# Patient Record
Sex: Male | Born: 1960 | Race: Black or African American | Hispanic: No | Marital: Single | Smoking: Current every day smoker
Health system: Southern US, Community
[De-identification: ages and names within clinical notes are randomized; demographics above are authoritative.]

## PROBLEM LIST (undated history)

## (undated) ENCOUNTER — Ambulatory Visit: Admission: EM | Payer: Medicaid Other | Source: Home / Self Care

## (undated) DIAGNOSIS — R079 Chest pain, unspecified: Secondary | ICD-10-CM

## (undated) DIAGNOSIS — F32A Depression, unspecified: Secondary | ICD-10-CM

## (undated) DIAGNOSIS — E230 Hypopituitarism: Secondary | ICD-10-CM

## (undated) DIAGNOSIS — B029 Zoster without complications: Secondary | ICD-10-CM

## (undated) DIAGNOSIS — F209 Schizophrenia, unspecified: Secondary | ICD-10-CM

## (undated) DIAGNOSIS — E2740 Unspecified adrenocortical insufficiency: Secondary | ICD-10-CM

## (undated) DIAGNOSIS — E291 Testicular hypofunction: Secondary | ICD-10-CM

## (undated) DIAGNOSIS — F419 Anxiety disorder, unspecified: Secondary | ICD-10-CM

## (undated) DIAGNOSIS — E039 Hypothyroidism, unspecified: Secondary | ICD-10-CM

## (undated) DIAGNOSIS — F319 Bipolar disorder, unspecified: Secondary | ICD-10-CM

## (undated) DIAGNOSIS — E785 Hyperlipidemia, unspecified: Secondary | ICD-10-CM

## (undated) DIAGNOSIS — R51 Headache: Secondary | ICD-10-CM

## (undated) DIAGNOSIS — Z720 Tobacco use: Secondary | ICD-10-CM

## (undated) DIAGNOSIS — R519 Headache, unspecified: Secondary | ICD-10-CM

## (undated) DIAGNOSIS — D649 Anemia, unspecified: Secondary | ICD-10-CM

## (undated) DIAGNOSIS — F329 Major depressive disorder, single episode, unspecified: Secondary | ICD-10-CM

## (undated) HISTORY — DX: Hypopituitarism: E23.0

## (undated) HISTORY — DX: Testicular hypofunction: E29.1

## (undated) HISTORY — DX: Schizophrenia, unspecified: F20.9

## (undated) HISTORY — DX: Chest pain, unspecified: R07.9

## (undated) HISTORY — DX: Anemia, unspecified: D64.9

## (undated) HISTORY — DX: Hyperlipidemia, unspecified: E78.5

## (undated) HISTORY — DX: Depression, unspecified: F32.A

## (undated) HISTORY — DX: Bipolar disorder, unspecified: F31.9

## (undated) HISTORY — DX: Tobacco use: Z72.0

## (undated) HISTORY — DX: Major depressive disorder, single episode, unspecified: F32.9

## (undated) HISTORY — DX: Zoster without complications: B02.9

## (undated) HISTORY — DX: Unspecified adrenocortical insufficiency: E27.40

---

## 2001-01-10 ENCOUNTER — Encounter: Admission: RE | Admit: 2001-01-10 | Discharge: 2001-01-10 | Payer: Self-pay | Admitting: Internal Medicine

## 2005-06-12 HISTORY — PX: HEMORROIDECTOMY: SUR656

## 2005-10-05 ENCOUNTER — Ambulatory Visit: Payer: Self-pay | Admitting: Nurse Practitioner

## 2005-12-29 ENCOUNTER — Encounter (INDEPENDENT_AMBULATORY_CARE_PROVIDER_SITE_OTHER): Payer: Self-pay | Admitting: Specialist

## 2005-12-29 ENCOUNTER — Ambulatory Visit (HOSPITAL_COMMUNITY): Admission: RE | Admit: 2005-12-29 | Discharge: 2005-12-29 | Payer: Self-pay | Admitting: Surgery

## 2006-09-11 ENCOUNTER — Emergency Department (HOSPITAL_COMMUNITY): Admission: EM | Admit: 2006-09-11 | Discharge: 2006-09-11 | Payer: Self-pay | Admitting: Emergency Medicine

## 2007-02-22 ENCOUNTER — Ambulatory Visit: Payer: Self-pay | Admitting: Internal Medicine

## 2007-02-25 ENCOUNTER — Emergency Department (HOSPITAL_COMMUNITY): Admission: EM | Admit: 2007-02-25 | Discharge: 2007-02-25 | Payer: Self-pay | Admitting: Emergency Medicine

## 2007-04-11 ENCOUNTER — Ambulatory Visit: Payer: Self-pay | Admitting: Internal Medicine

## 2010-10-28 NOTE — Op Note (Signed)
Neil [name], Neil [name]                ACCOUNT NO.:  [id number]   MEDICAL RECORD NO.:  [id number]          PATIENT TYPE:  AMB   LOCATION:  DAY                          FACILITY:  [facility]   PHYSICIAN:  Wilmon Arms. Corliss Skains, M.D. DATE OF BIRTH:  February 21, 1961   DATE OF PROCEDURE:  12/29/2005  DATE OF DISCHARGE:                                 OPERATIVE REPORT   PREOP DIAGNOSIS:  Prolapsed internal hemorrhoids.   POSTOPERATIVE DIAGNOSIS:  Prolapsed internal hemorrhoids.   PROCEDURE PERFORMED:  Procedure for prolapsed hemorrhoids (stapled  hemorrhoidpexy).   SURGEON:  Dr. Manus Rudd.   ANESTHESIA:  General endotracheal.   INDICATIONS:  The patient is a 50 year old male with chronic constipation,  who presents with bleeding per rectum.  When he has bowel movements, he  feels like something has protruded from his anus.  This occasionally  requires manual reduction.  The patient has no family history of colon  cancer, Crohn's disease or ulcerative colitis.  He is then referred for  surgical evaluation.  The patient had no external hemorrhoids but did have  several large internal hemorrhoids which seemed to prolapse.  PPH was  recommended to the patient.   DESCRIPTION OF PROCEDURE:  The patient was brought to the operating room and  was placed supine position on the stretcher.  After an adequate level of  general endotracheal anesthesia was obtained, the patient was rotated to a  prone position with appropriate padding.  Pneumatic compression hose had  been placed prior to induction.  The table was positioned in jackknife  position.  His buttocks were taped apart.  His perineum was then prepped  with Betadine and draped in sterile fashion.  The patient had undergone a  preoperative rectal prep.  Digital rectal examination showed no external  hemorrhoids but several large internal hemorrhoids.  The anus was slowly  dilated up to 3 fingers.  Circumferential inspection with the silver bullet  retractor confirmed internal hemorrhoids but no other masses.  The [facility] kit  was then opened.  The dilator was lubricated and slowly inserted and held in  position for several minutes.  The dilator was then removed and inserted  into the clear retractor.  The dilator and clear retractor were inserted and  the dilator was removed.  The clear retractor was secured with 2 interrupted  nylon sutures.  The working retractor was then inserted through the clear  retractor.  A pursestring suture of 2-0 Prolene was then placed several  centimeters above the dentate line.  The working retractor was  intermittently removed and slowly rotated to provide circumferential  exposure for the pursestring suture.  Once the pursestring suture was  complete, the working retractor was removed.  I inserted a finger and pulled  up on the pursestring suture to make sure there was a  nice circumferential  pursestring.  The [facility] stapler was then opened completely and the anvil was  inserted above the pursestring suture.  The pursestring was then tied down.  The tails were passed through the side ports of the stapler and looped  together anterior to  the stapler.  We then closed the stapler and held it in  place for over 30 seconds.  The stapler was 4 cm above the dentate line.  After an appropriate amount of time had passed the stapler was fired.  The  stapler and retractors were then removed.  We then reinserted the clear  retractor.  The stapler was opened and a thick rim of mucosa submucosa was  removed from the stapler.  This was not a complete ring.  We then re-  inspected the staple line.  The staple line seemed to be intact  circumferentially.  There was one bleeding area noted anteriorly which was  ligated with a 3-0 Vicryl suture.  The rectum was then thoroughly irrigated  with saline.  No other bleeding was noted.  Gelfoam was then placed in the anal canal and  the retractors were all removed.  The patient was  then flipped back to a  supine position, was extubated and brought to recovery room in stable  condition.  All sponge, instrument  and needle counts were correct.      Wilmon Arms. Tsuei, M.D.  Electronically Signed     MKT/MEDQ  D:  12/29/2005  T:  12/29/2005  Job:  [id number]

## 2011-01-17 ENCOUNTER — Encounter: Payer: Self-pay | Admitting: Internal Medicine

## 2011-01-25 ENCOUNTER — Ambulatory Visit (AMBULATORY_SURGERY_CENTER): Payer: Medicaid Other | Admitting: *Deleted

## 2011-01-25 VITALS — Ht 69.0 in | Wt 152.0 lb

## 2011-01-25 DIAGNOSIS — Z1211 Encounter for screening for malignant neoplasm of colon: Secondary | ICD-10-CM

## 2011-01-25 MED ORDER — PEG-KCL-NACL-NASULF-NA ASC-C 100 G PO SOLR: ORAL | Status: DC

## 2011-01-31 ENCOUNTER — Other Ambulatory Visit: Payer: Self-pay | Admitting: Internal Medicine

## 2011-03-15 ENCOUNTER — Emergency Department (HOSPITAL_COMMUNITY)
Admission: EM | Admit: 2011-03-15 | Discharge: 2011-03-15 | Discharge status: Against Medical Advice | Payer: Medicaid Other | Attending: Emergency Medicine | Admitting: Emergency Medicine

## 2011-03-15 DIAGNOSIS — L989 Disorder of the skin and subcutaneous tissue, unspecified: Secondary | ICD-10-CM | POA: Insufficient documentation

## 2011-11-20 ENCOUNTER — Encounter: Payer: Self-pay | Admitting: Internal Medicine

## 2011-12-08 ENCOUNTER — Ambulatory Visit (AMBULATORY_SURGERY_CENTER): Payer: Medicaid Other | Admitting: *Deleted

## 2011-12-08 VITALS — Ht 68.5 in | Wt 150.7 lb

## 2011-12-08 DIAGNOSIS — Z1211 Encounter for screening for malignant neoplasm of colon: Secondary | ICD-10-CM

## 2011-12-08 NOTE — Progress Notes (Signed)
No allergies to eggs or soy products  Pt given Suprep

## 2011-12-18 ENCOUNTER — Telehealth: Payer: Self-pay | Admitting: Gastroenterology

## 2011-12-18 NOTE — Telephone Encounter (Signed)
On call note @ 2015. Pt said he has had nuts in his diet and noted that it was not recommended within 5 days of colonoscopy. Wanted to know if he should cancel his colonoscopy on Thursday. Advised that he is fine to proceed with the colonoscopy as scheduled and to follow the diet and prep instructions through Thursday.

## 2011-12-19 NOTE — Addendum Note (Signed)
Addended by: Maple Hudson on: 12/19/2011 09:44 AM   Modules accepted: Level of Service

## 2011-12-21 ENCOUNTER — Encounter: Payer: Self-pay | Admitting: Internal Medicine

## 2011-12-21 ENCOUNTER — Ambulatory Visit (AMBULATORY_SURGERY_CENTER): Payer: Medicaid Other | Admitting: Internal Medicine

## 2011-12-21 VITALS — BP 127/81 | HR 76 | Temp 98.3°F | Resp 16 | Ht 68.5 in | Wt 150.0 lb

## 2011-12-21 DIAGNOSIS — K635 Polyp of colon: Secondary | ICD-10-CM

## 2011-12-21 DIAGNOSIS — D126 Benign neoplasm of colon, unspecified: Secondary | ICD-10-CM

## 2011-12-21 DIAGNOSIS — Z1211 Encounter for screening for malignant neoplasm of colon: Secondary | ICD-10-CM

## 2011-12-21 MED ORDER — SODIUM CHLORIDE 0.9 % IV SOLN: 500.0000 mL | INTRAVENOUS | Status: DC

## 2011-12-21 NOTE — Progress Notes (Signed)
Pt states that he did not complete his second bottle of prep. He states he drank 8 oz and was terribly nauseated so he didn't drink anymore. Pt states that he has clear yellow stools with no flecks of stool, no solid stool. Dr Rhea Belton notified of this and states if pt goes to bathroom in admitting for [place] to view the stool but otherwise will proceed due to pt stating stool clear yellow. ewm

## 2011-12-21 NOTE — Op Note (Signed)
[facility] [address] [place], [place]  [address]  COLONOSCOPY PROCEDURE REPORT  PATIENT:  Neil, [name]  MR#:  [id number] BIRTHDATE:  [birth year], 50 yrs. old  GENDER:  male ENDOSCOPIST:  Carie Caddy. Kashari Chalmers, MD REF. BY:  Della Goo, M.D. PROCEDURE DATE:  12/21/2011 PROCEDURE:  Colonoscopy with biopsy, Colon with cold biopsy polypectomy ASA CLASS:  Class II INDICATIONS:  Routine Risk Screening, 1st colonoscopy MEDICATIONS:   MAC sedation, administered by CRNA, propofol (Diprivan) 200 mg IV  DESCRIPTION OF PROCEDURE:   After the risks benefits and alternatives of the procedure were thoroughly explained, informed consent was obtained.  Digital rectal exam was performed and revealed no rectal masses.   The LB CF-[id number] endoscope was introduced through the anus and advanced to the terminal ileum which was intubated for a short distance, without limitations. The quality of the prep was good, using MoviPrep.  The instrument was then slowly withdrawn as the colon was fully examined. <<PROCEDUREIMAGES>>  FINDINGS:  The terminal ileum appeared normal.  Scattered diverticula were found ascending colon to sigmoid colon.  Two sessile, very small (2 - 3 mm) polyps were found in the recto-sigmoid colon. The polyp was removed using cold biopsy forceps.  There was a possible polyp in the in the rectum at the site of prior suture.  Suture is intact and surround tissue may be granulation tissue. Multiple biopsies were obtained and sent to pathology to exclude adenomatous change.   Retroflexed views in the rectum revealed internal hemorrhoids.  The scope was then withdrawn from the cecum and the procedure completed.  COMPLICATIONS:  None ENDOSCOPIC IMPRESSION: 1) Normal terminal ileum 2) Diverticula, scattered ascending colon to sigmoid colon 3) Two polyps in the recto-sigmoid colon. Removed and sent to pathology. 4) Polyp, possible in the rectum at site of previously  placed suture.  Biopsies performed. 5) Internal hemorrhoids  RECOMMENDATIONS: 1) Await pathology results 2) High fiber diet. 3) Timing of repeat colonoscopy will be determined by pathology findings. 4) You will receive a letter within 1-2 weeks with the results of your biopsy as well as final recommendations. Please call my office if you have not received a letter after 3 weeks.  Carie Caddy. Rhea Belton, MD  CC:  Della Goo, MD The Patient  n. eSIGNEDCarie Caddy. [employer] at 12/21/2011 11:48 AM  Valene Bors, [phone]

## 2011-12-21 NOTE — Progress Notes (Signed)
Patient did not have preoperative order for IV antibiotic SSI prophylaxis. (G8918)  Patient did not experience any of the following events: a burn prior to discharge; a fall within the facility; wrong site/side/patient/procedure/implant event; or a hospital transfer or hospital admission upon discharge from the facility. (G8907)  

## 2011-12-21 NOTE — Patient Instructions (Addendum)
YOU HAD AN ENDOSCOPIC PROCEDURE TODAY AT THE [facility]: Refer to the procedure report that was given to you for any specific questions about what was found during the examination.  If the procedure report does not answer your questions, please call your gastroenterologist to clarify.  If you requested that your care partner not be given the details of your procedure findings, then the procedure report has been included in a sealed envelope for you to review at your convenience later.  YOU SHOULD EXPECT: Some feelings of bloating in the abdomen. Passage of more gas than usual.  Walking can help get rid of the air that was put into your GI tract during the procedure and reduce the bloating. If you had a lower endoscopy (such as a colonoscopy or flexible sigmoidoscopy) you may notice spotting of blood in your stool or on the toilet paper. If you underwent a bowel prep for your procedure, then you may not have a normal bowel movement for a few days.  DIET: Your first meal following the procedure should be a light meal and then it is ok to progress to your normal diet.  A half-sandwich or bowl of soup is an example of a good first meal.  Heavy or fried foods are harder to digest and may make you feel nauseous or bloated.  Likewise meals heavy in dairy and vegetables can cause extra gas to form and this can also increase the bloating.  Drink plenty of fluids but you should avoid alcoholic beverages for 24 hours.  ACTIVITY: Your care partner should take you home directly after the procedure.  You should plan to take it easy, moving slowly for the rest of the day.  You can resume normal activity the day after the procedure however you should NOT DRIVE or use heavy machinery for 24 hours (because of the sedation medicines used during the test).    SYMPTOMS TO REPORT IMMEDIATELY: A gastroenterologist can be reached at any hour.  During normal business hours, 8:30 AM to 5:00 PM Monday through Friday,  call [phone].  After hours and on weekends, please call the GI answering service at [phone] who will take a message and have the physician on call contact you.   Following lower endoscopy (colonoscopy or flexible sigmoidoscopy):  Excessive amounts of blood in the stool  Significant tenderness or worsening of abdominal pains  Swelling of the abdomen that is new, acute  Fever of 100F or higher    FOLLOW UP: If any biopsies were taken you will be contacted by phone or by letter within the next 1-3 weeks.  Call your gastroenterologist if you have not heard about the biopsies in 3 weeks.  Our staff will call the home number listed on your records the next business day following your procedure to check on you and address any questions or concerns that you may have at that time regarding the information given to you following your procedure. This is a courtesy call and so if there is no answer at the home number and we have not heard from you through the emergency physician on call, we will assume that you have returned to your regular daily activities without incident.  SIGNATURES/CONFIDENTIALITY: You and/or your care partner have signed paperwork which will be entered into your electronic medical record.  These signatures attest to the fact that that the information above on your After Visit Summary has been reviewed and is understood.  Full responsibility of the confidentiality   of this discharge information lies with you and/or your care-partner.     

## 2011-12-22 ENCOUNTER — Telehealth: Payer: Self-pay | Admitting: *Deleted

## 2011-12-22 NOTE — Telephone Encounter (Signed)
  Follow up Call-  Call back number 12/21/2011  Post procedure Call Back phone  # [phone]  Permission to leave phone message Yes     Patient questions:  Left message to call us if necessary.

## 2011-12-26 ENCOUNTER — Encounter: Payer: Self-pay | Admitting: Internal Medicine

## 2014-10-29 ENCOUNTER — Telehealth: Payer: Self-pay | Admitting: Internal Medicine

## 2014-10-29 NOTE — Telephone Encounter (Signed)
Call to patient to confirm appointment for 11/02/14 at 2:15 lmtcb

## 2014-11-02 ENCOUNTER — Encounter: Payer: Self-pay | Admitting: Internal Medicine

## 2014-11-02 ENCOUNTER — Ambulatory Visit (INDEPENDENT_AMBULATORY_CARE_PROVIDER_SITE_OTHER): Payer: Medicaid Other | Admitting: Internal Medicine

## 2014-11-02 VITALS — BP 117/74 | HR 53 | Temp 98.1°F | Ht 69.0 in | Wt 152.8 lb

## 2014-11-02 DIAGNOSIS — F2089 Other schizophrenia: Secondary | ICD-10-CM

## 2014-11-02 DIAGNOSIS — F209 Schizophrenia, unspecified: Secondary | ICD-10-CM

## 2014-11-02 DIAGNOSIS — F32A Depression, unspecified: Secondary | ICD-10-CM

## 2014-11-02 DIAGNOSIS — Z0189 Encounter for other specified special examinations: Secondary | ICD-10-CM

## 2014-11-02 DIAGNOSIS — F339 Major depressive disorder, recurrent, unspecified: Secondary | ICD-10-CM

## 2014-11-02 DIAGNOSIS — F329 Major depressive disorder, single episode, unspecified: Secondary | ICD-10-CM

## 2014-11-02 DIAGNOSIS — F17210 Nicotine dependence, cigarettes, uncomplicated: Secondary | ICD-10-CM

## 2014-11-02 DIAGNOSIS — B029 Zoster without complications: Secondary | ICD-10-CM | POA: Insufficient documentation

## 2014-11-02 DIAGNOSIS — Z7689 Persons encountering health services in other specified circumstances: Secondary | ICD-10-CM

## 2014-11-02 LAB — BASIC METABOLIC PANEL WITH GFR
BUN: 12 mg/dL (ref 6–23)
CO2: 29 mEq/L (ref 19–32)
Calcium: 9.6 mg/dL (ref 8.4–10.5)
Chloride: 106 mEq/L (ref 96–112)
Creat: 1.12 mg/dL (ref 0.50–1.35)
GFR, EST AFRICAN AMERICAN: 86 mL/min
GFR, EST NON AFRICAN AMERICAN: 74 mL/min
Glucose, Bld: 57 mg/dL — ABNORMAL LOW (ref 70–99)
Potassium: 4.7 mEq/L (ref 3.5–5.3)
SODIUM: 139 meq/L (ref 135–145)

## 2014-11-02 LAB — LIPID PANEL
CHOLESTEROL: 195 mg/dL (ref 0–200)
HDL: 37 mg/dL — ABNORMAL LOW (ref >=40)
LDL Cholesterol: 128 mg/dL — ABNORMAL HIGH (ref 0–99)
Total CHOL/HDL Ratio: 5.3 Ratio
Triglycerides: 150 mg/dL — ABNORMAL HIGH (ref <150)
VLDL: 30 mg/dL (ref 0–40)

## 2014-11-02 LAB — CBC
HCT: 37.2 % — ABNORMAL LOW (ref 39.0–52.0)
HEMOGLOBIN: 12.2 g/dL — AB (ref 13.0–17.0)
MCH: 30 pg (ref 26.0–34.0)
MCHC: 32.8 g/dL (ref 30.0–36.0)
MCV: 91.6 fL (ref 78.0–100.0)
MPV: 9.8 fL (ref 8.6–12.4)
Platelets: 331 10*3/uL (ref 150–400)
RBC: 4.06 MIL/uL — AB (ref 4.22–5.81)
RDW: 15.2 % (ref 11.5–15.5)
WBC: 5 10*3/uL (ref 4.0–10.5)

## 2014-11-02 LAB — POCT GLYCOSYLATED HEMOGLOBIN (HGB A1C): Hemoglobin A1C: 5.4

## 2014-11-02 LAB — GLUCOSE, CAPILLARY: Glucose-Capillary: 71 mg/dL (ref 65–99)

## 2014-11-02 MED ORDER — VALACYCLOVIR HCL 500 MG PO TABS: 500.0000 mg | ORAL_TABLET | Freq: Every day | ORAL | Status: DC

## 2014-11-02 NOTE — Assessment & Plan Note (Signed)
Follows w/ Beverly Sessions. Will need to obtain records. Instructed pt to bring his medications with him to next appt.

## 2014-11-02 NOTE — Progress Notes (Signed)
   Subjective:    Patient ID: Neil [name], male    DOB: June 06, 1961, 54 y.o.   MRN: [id number]  HPI Pt is a 54 y/o male w/ PMHx of depression, schizophrenia, and shingles who presents to clinic to establish care.  He was a previous patient of Dr. Arnoldo Morale in [place], [place]. Family hx, social hx, and surgical hx updated. Old records requested. Please see problem list for further details.   Social hx: lives with brother, has one daughter that is healthy. 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 He is unemployed and currently seeking disability for depression.      Review of Systems  Constitutional: Negative for fever, activity change and appetite change.  HENT: Negative for sore throat.   Respiratory: Negative for shortness of breath.   Cardiovascular: Positive for chest pain (chest wall tenderness, thinks due to sleeping position).  Gastrointestinal: Negative for nausea, vomiting and abdominal pain.  Neurological: Negative for dizziness and light-headedness.  Psychiatric/Behavioral:       Mood is stable        Objective:   Physical Exam  Constitutional: He appears well-developed and well-nourished.  HENT:  Head: Normocephalic.  Mouth/Throat: Oropharynx is clear and moist. No oropharyngeal exudate.  Eyes: Conjunctivae and EOM are normal. Pupils are equal, round, and reactive to light.  Cardiovascular: Normal rate and regular rhythm.   Pulmonary/Chest: Effort normal and breath sounds normal. He has no wheezes. He exhibits tenderness.  Abdominal: Soft. Bowel sounds are normal. He exhibits no distension. There is no tenderness.  Skin: Skin is warm and dry.          Assessment & Plan:  Please see problem based assessment and plan.

## 2014-11-02 NOTE — Patient Instructions (Signed)
It was a pleasure meeting you today. We will check blood labs and I will call you if any results are abnormal.   I have refilled your valtrex 500mg  daily.   Please make sure to bring all of your medications with you to your next visit in 3 months.

## 2014-11-02 NOTE — Assessment & Plan Note (Addendum)
Pt was a patient of Dr. Arnoldo Morale, he now wishes to establish care with our clinic. He has not seen a PCP in over a year.   - will request for old medical records to be transferred - CBC, BMET, lipid panel, HbA1c, HIV, Hep C ordered - colonoscopy in 2013 w/ polyps removed that were neg for malignancy, due for next colo in 2018 - will call pt with results, otherwise can follow up in 3 months of labs WNL.

## 2014-11-02 NOTE — Assessment & Plan Note (Addendum)
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 He has been out of this medication for a while now and last outbreak was 1-2 months ago. He did not take anything for this episode since he was out of meds.   - refilled valtrex 500mg  daily - f/u in 3 months, will review old records at that time and continue valtrex as appropriate.

## 2014-11-02 NOTE — Assessment & Plan Note (Addendum)
Pt states he has depression and schizophreniza which is follows with [facility] for. States he is on abilify and 2 other medications which he cannot recall. Last saw [facility] 2 months ago, mood has been stable. Instructed to bring all of his medications with him to his next visit.

## 2014-11-03 LAB — HIV ANTIBODY (ROUTINE TESTING W REFLEX): HIV 1&2 Ab, 4th Generation: NONREACTIVE

## 2014-11-03 LAB — HEPATITIS C ANTIBODY: HCV AB: NEGATIVE

## 2014-11-03 NOTE — Progress Notes (Signed)
Internal Medicine Clinic Attending  Case discussed with Dr. Hulen Luster at the time of the visit.  We reviewed the resident's history and exam and pertinent patient test results.  I agree with the assessment, diagnosis, and plan of care documented in the resident's note. 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 Therefore, agree with getting records before Rxing indefinite Valtrex. HIV negative at appt.

## 2014-11-18 ENCOUNTER — Other Ambulatory Visit: Payer: Self-pay | Admitting: *Deleted

## 2014-11-18 ENCOUNTER — Emergency Department (HOSPITAL_COMMUNITY)
Admission: EM | Admit: 2014-11-18 | Discharge: 2014-11-18 | Discharge status: Against Medical Advice | Payer: Medicaid Other | Attending: Emergency Medicine | Admitting: Emergency Medicine

## 2014-11-18 ENCOUNTER — Encounter (HOSPITAL_COMMUNITY): Payer: Self-pay | Admitting: Emergency Medicine

## 2014-11-18 DIAGNOSIS — R110 Nausea: Secondary | ICD-10-CM | POA: Insufficient documentation

## 2014-11-18 DIAGNOSIS — Z720 Tobacco use: Secondary | ICD-10-CM | POA: Insufficient documentation

## 2014-11-18 DIAGNOSIS — K5900 Constipation, unspecified: Secondary | ICD-10-CM | POA: Insufficient documentation

## 2014-11-18 DIAGNOSIS — R1084 Generalized abdominal pain: Secondary | ICD-10-CM | POA: Insufficient documentation

## 2014-11-18 LAB — CBC WITH DIFFERENTIAL/PLATELET
BASOS ABS: 0 10*3/uL (ref 0.0–0.1)
BASOS PCT: 0 % (ref 0–1)
EOS ABS: 0.2 10*3/uL (ref 0.0–0.7)
EOS PCT: 2 % (ref 0–5)
HEMATOCRIT: 36.6 % — AB (ref 39.0–52.0)
Hemoglobin: 12.3 g/dL — ABNORMAL LOW (ref 13.0–17.0)
Lymphocytes Relative: 39 % (ref 12–46)
Lymphs Abs: 2.8 10*3/uL (ref 0.7–4.0)
MCH: 30.8 pg (ref 26.0–34.0)
MCHC: 33.6 g/dL (ref 30.0–36.0)
MCV: 91.5 fL (ref 78.0–100.0)
MONO ABS: 0.5 10*3/uL (ref 0.1–1.0)
MONOS PCT: 6 % (ref 3–12)
Neutro Abs: 3.8 10*3/uL (ref 1.7–7.7)
Neutrophils Relative %: 53 % (ref 43–77)
PLATELETS: 318 10*3/uL (ref 150–400)
RBC: 4 MIL/uL — AB (ref 4.22–5.81)
RDW: 14.3 % (ref 11.5–15.5)
WBC: 7.3 10*3/uL (ref 4.0–10.5)

## 2014-11-18 LAB — URINALYSIS, ROUTINE W REFLEX MICROSCOPIC
BILIRUBIN URINE: NEGATIVE
GLUCOSE, UA: NEGATIVE mg/dL
HGB URINE DIPSTICK: NEGATIVE
Ketones, ur: NEGATIVE mg/dL
LEUKOCYTES UA: NEGATIVE
NITRITE: NEGATIVE
PH: 5.5 (ref 5.0–8.0)
PROTEIN: NEGATIVE mg/dL
Specific Gravity, Urine: 1.023 (ref 1.005–1.030)
UROBILINOGEN UA: 0.2 mg/dL (ref 0.0–1.0)

## 2014-11-18 LAB — COMPREHENSIVE METABOLIC PANEL
ALK PHOS: 51 U/L (ref 38–126)
ALT: 16 U/L — AB (ref 17–63)
ANION GAP: 9 (ref 5–15)
AST: 22 U/L (ref 15–41)
Albumin: 4.1 g/dL (ref 3.5–5.0)
BUN: 15 mg/dL (ref 6–20)
CALCIUM: 9.3 mg/dL (ref 8.9–10.3)
CHLORIDE: 106 mmol/L (ref 101–111)
CO2: 24 mmol/L (ref 22–32)
Creatinine, Ser: 1.04 mg/dL (ref 0.61–1.24)
GFR calc non Af Amer: >60 mL/min (ref >60)
GLUCOSE: 122 mg/dL — AB (ref 65–99)
POTASSIUM: 3.9 mmol/L (ref 3.5–5.1)
SODIUM: 139 mmol/L (ref 135–145)
Total Bilirubin: 0.6 mg/dL (ref 0.3–1.2)
Total Protein: 7.1 g/dL (ref 6.5–8.1)

## 2014-11-18 LAB — LIPASE, BLOOD: LIPASE: 20 U/L — AB (ref 22–51)

## 2014-11-18 NOTE — ED Notes (Signed)
Per [facility] @ nurse first pt left.

## 2014-11-18 NOTE — ED Notes (Signed)
Called at triage for room x2. No answer

## 2014-11-18 NOTE — Telephone Encounter (Signed)
Pt given this Rx on 5/23 but he states it's to strong and would like to change to Acyclovir.

## 2014-11-18 NOTE — ED Notes (Signed)
C/o generalized abd pain that radiates to back with nausea and constipation x 2 days.    Last BM yesterday.  Pt believes symptoms may be related to Valtrex that he started 2 days ago.

## 2014-11-19 ENCOUNTER — Emergency Department (HOSPITAL_COMMUNITY)
Admission: EM | Admit: 2014-11-19 | Discharge: 2014-11-19 | Disposition: A | Discharge status: Home / Self Care | Payer: Medicaid Other | Attending: Emergency Medicine | Admitting: Emergency Medicine

## 2014-11-19 ENCOUNTER — Encounter (HOSPITAL_COMMUNITY): Payer: Self-pay | Admitting: *Deleted

## 2014-11-19 DIAGNOSIS — Z8659 Personal history of other mental and behavioral disorders: Secondary | ICD-10-CM | POA: Insufficient documentation

## 2014-11-19 DIAGNOSIS — Z79899 Other long term (current) drug therapy: Secondary | ICD-10-CM | POA: Insufficient documentation

## 2014-11-19 DIAGNOSIS — K5900 Constipation, unspecified: Secondary | ICD-10-CM | POA: Insufficient documentation

## 2014-11-19 DIAGNOSIS — Z8619 Personal history of other infectious and parasitic diseases: Secondary | ICD-10-CM | POA: Insufficient documentation

## 2014-11-19 DIAGNOSIS — Z720 Tobacco use: Secondary | ICD-10-CM | POA: Insufficient documentation

## 2014-11-19 DIAGNOSIS — R1084 Generalized abdominal pain: Secondary | ICD-10-CM

## 2014-11-19 LAB — CBC WITH DIFFERENTIAL/PLATELET
BASOS ABS: 0 10*3/uL (ref 0.0–0.1)
Basophils Relative: 0 % (ref 0–1)
EOS ABS: 0.2 10*3/uL (ref 0.0–0.7)
EOS PCT: 3 % (ref 0–5)
HCT: 38.9 % — ABNORMAL LOW (ref 39.0–52.0)
Hemoglobin: 13 g/dL (ref 13.0–17.0)
LYMPHS ABS: 2.6 10*3/uL (ref 0.7–4.0)
Lymphocytes Relative: 33 % (ref 12–46)
MCH: 31 pg (ref 26.0–34.0)
MCHC: 33.4 g/dL (ref 30.0–36.0)
MCV: 92.6 fL (ref 78.0–100.0)
MONO ABS: 0.6 10*3/uL (ref 0.1–1.0)
MONOS PCT: 8 % (ref 3–12)
Neutro Abs: 4.3 10*3/uL (ref 1.7–7.7)
Neutrophils Relative %: 56 % (ref 43–77)
Platelets: 342 10*3/uL (ref 150–400)
RBC: 4.2 MIL/uL — ABNORMAL LOW (ref 4.22–5.81)
RDW: 14.6 % (ref 11.5–15.5)
WBC: 7.7 10*3/uL (ref 4.0–10.5)

## 2014-11-19 LAB — COMPREHENSIVE METABOLIC PANEL
ALK PHOS: 51 U/L (ref 38–126)
ALT: 16 U/L — ABNORMAL LOW (ref 17–63)
AST: 23 U/L (ref 15–41)
Albumin: 4.3 g/dL (ref 3.5–5.0)
Anion gap: 8 (ref 5–15)
BUN: 11 mg/dL (ref 6–20)
CALCIUM: 9.7 mg/dL (ref 8.9–10.3)
CO2: 28 mmol/L (ref 22–32)
Chloride: 104 mmol/L (ref 101–111)
Creatinine, Ser: 1.06 mg/dL (ref 0.61–1.24)
GFR calc Af Amer: >60 mL/min (ref >60)
GFR calc non Af Amer: >60 mL/min (ref >60)
Glucose, Bld: 94 mg/dL (ref 65–99)
Potassium: 4.5 mmol/L (ref 3.5–5.1)
SODIUM: 140 mmol/L (ref 135–145)
TOTAL PROTEIN: 7.5 g/dL (ref 6.5–8.1)
Total Bilirubin: 0.6 mg/dL (ref 0.3–1.2)

## 2014-11-19 LAB — LIPASE, BLOOD: Lipase: 19 U/L — ABNORMAL LOW (ref 22–51)

## 2014-11-19 MED ORDER — POLYETHYLENE GLYCOL 3350 17 G PO PACK: 17.0000 g | PACK | Freq: Every day | ORAL | Status: DC | PRN

## 2014-11-19 NOTE — Discharge Instructions (Signed)
Abdominal Pain °Many things can cause abdominal pain. Usually, abdominal pain is not caused by a disease and will improve without treatment. It can often be observed and treated at home. Your health care provider will do a physical exam and possibly order blood tests and X-rays to help determine the seriousness of your pain. However, in many cases, more time must pass before a clear cause of the pain can be found. Before that point, your health care provider may not know if you need more testing or further treatment. °HOME CARE INSTRUCTIONS  °Monitor your abdominal pain for any changes. The following actions may help to alleviate any discomfort you are experiencing: °· Only take over-the-counter or prescription medicines as directed by your health care provider. °· Do not take laxatives unless directed to do so by your health care provider. °· Try a clear liquid diet (broth, tea, or water) as directed by your health care provider. Slowly move to a bland diet as tolerated. °SEEK MEDICAL CARE IF: °· You have unexplained abdominal pain. °· You have abdominal pain associated with nausea or diarrhea. °· You have pain when you urinate or have a bowel movement. °· You experience abdominal pain that wakes you in the night. °· You have abdominal pain that is worsened or improved by eating food. °· You have abdominal pain that is worsened with eating fatty foods. °· You have a fever. °SEEK IMMEDIATE MEDICAL CARE IF:  °· Your pain does not go away within 2 hours. °· You keep throwing up (vomiting). °· Your pain is felt only in portions of the abdomen, such as the right side or the left lower portion of the abdomen. °· You pass bloody or black tarry stools. °MAKE SURE YOU: °· Understand these instructions.   °· Will watch your condition.   °· Will get help right away if you are not doing well or get worse.   °Document Released: 03/08/2005 Document Revised: 06/03/2013 Document Reviewed: 02/05/2013 °ExitCare® Patient Information  ©2015 [employer], [employer]. This information is not intended to replace advice given to you by your health care provider. Make sure you discuss any questions you have with your health care provider. ° °Constipation °Constipation is when a person has fewer than three bowel movements a week, has difficulty having a bowel movement, or has stools that are dry, hard, or larger than normal. As people grow older, constipation is more common. If you try to fix constipation with medicines that make you have a bowel movement (laxatives), the problem may get worse. Long-term laxative use may cause the muscles of the colon to become weak. A low-fiber diet, not taking in enough fluids, and taking certain medicines may make constipation worse.  °CAUSES  °· Certain medicines, such as antidepressants, pain medicine, iron supplements, antacids, and water pills.   °· Certain diseases, such as diabetes, irritable bowel syndrome (IBS), thyroid disease, or depression.   °· Not drinking enough water.   °· Not eating enough fiber-rich foods.   °· Stress or travel.   °· Lack of physical activity or exercise.   °· Ignoring the urge to have a bowel movement.   °· Using laxatives too much.   °SIGNS AND SYMPTOMS  °· Having fewer than three bowel movements a week.   °· Straining to have a bowel movement.   °· Having stools that are hard, dry, or larger than normal.   °· Feeling full or bloated.   °· Pain in the lower abdomen.   °· Not feeling relief after having a bowel movement.   °DIAGNOSIS  °Your health care provider will take   a medical history and perform a physical exam. Further testing may be done for severe constipation. Some tests may include:  A barium enema X-ray to examine your rectum, colon, and, sometimes, your small intestine.   A sigmoidoscopy to examine your lower colon.   A colonoscopy to examine your entire colon. TREATMENT  Treatment will depend on the severity of your constipation and what is causing it. Some dietary  treatments include drinking more fluids and eating more fiber-rich foods. Lifestyle treatments may include regular exercise. If these diet and lifestyle recommendations do not help, your health care provider may recommend taking over-the-counter laxative medicines to help you have bowel movements. Prescription medicines may be prescribed if over-the-counter medicines do not work.  HOME CARE INSTRUCTIONS   Eat foods that have a lot of fiber, such as fruits, vegetables, whole grains, and beans.  Limit foods high in fat and processed sugars, such as french fries, hamburgers, cookies, candies, and soda.   A fiber supplement may be added to your diet if you cannot get enough fiber from foods.   Drink enough fluids to keep your urine clear or pale yellow.   Exercise regularly or as directed by your health care provider.   Go to the restroom when you have the urge to go. Do not hold it.   Only take over-the-counter or prescription medicines as directed by your health care provider. Do not take other medicines for constipation without talking to your health care provider first.  [place] IF:   You have bright red blood in your stool.   Your constipation lasts for more than 4 days or gets worse.   You have abdominal or rectal pain.   You have thin, pencil-like stools.   You have unexplained weight loss. MAKE SURE YOU:   Understand these instructions.  Will watch your condition.  Will get help right away if you are not doing well or get worse. Document Released: 02/25/2004 Document Revised: 06/03/2013 Document Reviewed: 03/10/2013 [facility] Patient Information 2015 [place], [place]. This information is not intended to replace advice given to you by your health care provider. Make sure you discuss any questions you have with your health care provider. EDUCATION on Genital Herpes Genital herpes is a sexually transmitted disease. This means that it is a disease  passed by having sex with an infected person. There is no cure for genital herpes. The time between attacks can be months to years. The virus may live in a person but produce no problems (symptoms). This infection can be passed to a baby as it travels down the birth canal (vagina). In a newborn, this can cause central nervous system damage, eye damage, or even death. The virus that causes genital herpes is usually HSV-2 virus. The virus that causes oral herpes is usually HSV-1. The diagnosis (learning what is wrong) is made through culture results. SYMPTOMS  Usually symptoms of pain and itching begin a few days to a week after contact. It first appears as small blisters that progress to small painful ulcers which then scab over and heal after several days. It affects the outer genitalia, birth canal, cervix, penis, anal area, buttocks, and thighs. HOME CARE INSTRUCTIONS   Keep ulcerated areas dry and clean.  Take medications as directed. Antiviral medications can speed up healing. They will not prevent recurrences or cure this infection. These medications can also be taken for suppression if there are frequent recurrences.  While the infection is active, it is contagious. Avoid  all sexual contact during active infections.  Condoms may help prevent spread of the herpes virus.  Practice safe sex.  Wash your hands thoroughly after touching the genital area.  Avoid touching your eyes after touching your genital area.  Inform your caregiver if you have had genital herpes and become pregnant. It is your responsibility to insure a safe outcome for your baby in this pregnancy.  Only take over-the-counter or prescription medicines for pain, discomfort, or fever as directed by your caregiver. SEEK MEDICAL CARE IF:   You have a recurrence of this infection.  You do not respond to medications and are not improving.  You have new sources of pain or discharge which have changed from the original  infection.  You have an oral temperature above 102 F (38.9 C).  You develop abdominal pain.  You develop eye pain or signs of eye infection. Document Released: 05/26/2000 Document Revised: 08/21/2011 Document Reviewed: 06/16/2009 [facility] Patient Information 2015 [place], [place]. This information is not intended to replace advice given to you by your health care provider. Make sure you discuss any questions you have with your health care provider. EDUCATION on Shingles Shingles (herpes zoster) is an infection that is caused by the same virus that causes chickenpox (varicella). The infection causes a painful skin rash and fluid-filled blisters, which eventually break open, crust over, and heal. It may occur in any area of the body, but it usually affects only one side of the body or face. The pain of shingles usually lasts about 1 month. However, some people with shingles may develop long-term (chronic) pain in the affected area of the body. Shingles often occurs many years after the person had chickenpox. It is more common:  In people older than 50 years.  In people with weakened immune systems, such as those with HIV, AIDS, or cancer.  In people taking medicines that weaken the immune system, such as transplant medicines.  In people under great stress. CAUSES  Shingles is caused by the varicella zoster virus (VZV), which also causes chickenpox. After a person is infected with the virus, it can remain in the person's body for years in an inactive state (dormant). To cause shingles, the virus reactivates and breaks out as an infection in a nerve root. The virus can be spread from person to person (contagious) through contact with open blisters of the shingles rash. It will only spread to people who have not had chickenpox. When these people are exposed to the virus, they may develop chickenpox. They will not develop shingles. Once the blisters scab over, the person is no longer contagious and  cannot spread the virus to others. SIGNS AND SYMPTOMS  Shingles shows up in stages. The initial symptoms may be pain, itching, and tingling in an area of the skin. This pain is usually described as burning, stabbing, or throbbing.In a few days or weeks, a painful red rash will appear in the area where the pain, itching, and tingling were felt. The rash is usually on one side of the body in a band or belt-like pattern. Then, the rash usually turns into fluid-filled blisters. They will scab over and dry up in approximately 2-3 weeks. Flu-like symptoms may also occur with the initial symptoms, the rash, or the blisters. These may include:  Fever.  Chills.  Headache.  Upset stomach. DIAGNOSIS  Your health care provider will perform a skin exam to diagnose shingles. Skin scrapings or fluid samples may also be taken from the blisters. This  sample will be examined under a microscope or sent to a lab for further testing. TREATMENT  There is no specific cure for shingles. Your health care provider will likely prescribe medicines to help you manage the pain, recover faster, and avoid long-term problems. This may include antiviral drugs, anti-inflammatory drugs, and pain medicines. HOME CARE INSTRUCTIONS   Take a cool bath or apply cool compresses to the area of the rash or blisters as directed. This may help with the pain and itching.   Take medicines only as directed by your health care provider.   Rest as directed by your health care provider.  Keep your rash and blisters clean with mild soap and cool water or as directed by your health care provider.  Do not pick your blisters or scratch your rash. Apply an anti-itch cream or numbing creams to the affected area as directed by your health care provider.  Keep your shingles rash covered with a loose bandage (dressing).  Avoid skin contact with:  Babies.   Pregnant women.   Children with eczema.   Elderly people with transplants.    People with chronic illnesses, such as leukemia or AIDS.   Wear loose-fitting clothing to help ease the pain of material rubbing against the rash.  Keep all follow-up visits as directed by your health care provider.If the area involved is on your face, you may receive a referral for a specialist, such as an eye doctor (ophthalmologist) or an ear, nose, and throat (ENT) doctor. Keeping all follow-up visits will help you avoid eye problems, chronic pain, or disability.  SEEK IMMEDIATE MEDICAL CARE IF:   You have facial pain, pain around the eye area, or loss of feeling on one side of your face.  You have ear pain or ringing in your ear.  You have loss of taste.  Your pain is not relieved with prescribed medicines.   Your redness or swelling spreads.   You have more pain and swelling.  Your condition is worsening or has changed.   You have a fever. MAKE SURE YOU:  Understand these instructions.  Will watch your condition.  Will get help right away if you are not doing well or get worse. Document Released: 05/29/2005 Document Revised: 10/13/2013 Document Reviewed: 01/11/2012 [employer] Patient Information 2015 [place], [place]. This information is not intended to replace advice given to you by your health care provider. Make sure you discuss any questions you have with your health care provider.  Emergency Department Resource Guide 1) Find a Doctor and Pay Out of Pocket Although you won't have to find out who is covered by your insurance plan, it is a good idea to ask around and get recommendations. You will then need to call the office and see if the doctor you have chosen will accept you as a new patient and what types of options they offer for patients who are self-pay. Some doctors offer discounts or will set up payment plans for their patients who do not have insurance, but you will need to ask so you aren't surprised when you get to your appointment.  2) Contact Your  Local Health Department Not all health departments have doctors that can see patients for sick visits, but many do, so it is worth a call to see if yours does. If you don't know where your local health department is, you can check in your phone book. The CDC also has a tool to help you locate your state's health department, and many  state websites also have listings of all of their local health departments.  3) Find a [facility] If your illness is not likely to be very severe or complicated, you may want to try a walk in clinic. These are popping up all over the country in pharmacies, drugstores, and shopping centers. They're usually staffed by nurse practitioners or physician assistants that have been trained to treat common illnesses and complaints. They're usually fairly quick and inexpensive. However, if you have serious medical issues or chronic medical problems, these are probably not your best option.  No Primary Care Doctor: - Call Health Connect at  [phone] - they can help you locate a primary care doctor that  accepts your insurance, provides certain services, etc. - Physician Referral Service- [phone]  Chronic Pain Problems: Organization         Address  Phone   Notes  [facility]  [phone] Patients need to be referred by their primary care doctor.   Medication Assistance: Organization         Address  Phone   Notes  [facility] Medication [facility] [address] [place], [place] [address] [phone] --Must be a resident of [facility] -- Must have NO insurance coverage whatsoever (no Medicaid/ Medicare, etc.) -- The pt. MUST have a primary care doctor that directs their care regularly and follows them in the community   Med[facility]ist  [phone]   [employer]  [phone]    Agencies that provide inexpensive medical care: Organization         Address  Phone   Notes  [place]  [phone]   [facility]    [phone]   [facility] [place], [place] [address] [phone]   [place] [address] [place] [phone]   Planned Parenthood    [phone]   [facility]    [phone]   [place] and [place] [address], [place] Phone:  [phone], Fax:  [phone] Hours of Operation:  9 am - 6 pm, M-F.  Also accepts Medicaid/Medicare and self-pay.  [facility] for [place], [address], [place] Phone: [phone], Fax: [phone]. Hours of Operation:  8:30 am - 5:30 pm, M-F.  Also accepts Medicaid and self-pay.  [facility] [address], [place] Phone: [phone]   [place], [place], [place] [phone], Ext. [phone] Mondays & Thursdays: 7-9 AM.  First 15 patients are seen on a first come, first serve basis.    [place] Providers:  Organization         Address  Phone   Notes  [facility] [address], [place] [phone] Also accepts self-pay patients.  [facility] [address], [place]  [phone]   [place], [address], [place] [phone]   [facility] [address] [place] [phone]   Lucianne Lei [address], [place]   [phone] Only accepts [place] Access [place] patients after they have their name applied to their card.   Self-Pay (no insurance) in [facility]:  [employer]  Phone   Notes  Sickle Cell Patients, [facility] Internal Medicine [place] [phone]   [facility] Urgent Care [place] [phone]   [facility] [place]  [place],  [address], [place] [phone]   [facility]/Dr. Osei-Bonsu  [address], [place] or [address], [place] [phone] Phone number for both [place] and [place] locations is the same.  Urgent Medical and [facility] [address], [place] [phone]   [facility] [address] [place] or [address] [phone]   [facility] [address], [place] [phone], phone; [phone], fax Sees patients 1st and 3rd Saturday of every month.  Must not qualify for public or private insurance (i.e. Medicaid, Medicare, [place] Health Choice, Veterans' Benefits)  Household income should be no more than 200% of the poverty level The clinic cannot treat you if you are pregnant or think you are pregnant  Sexually transmitted diseases are not treated at the clinic.    Dental Care: Organization         Address  Phone  Notes  [facility] Department of [facility] [phone] Accepts children up to age 68 who are enrolled in [place] or [place]; pregnant women with a Medicaid card; and children who have applied for Medicaid or [place] Health Choice, but were declined, whose parents can pay a reduced fee at time of service.  [facility] Department of [facility]  [address], [place] [phone] Accepts children up to age 60 who are enrolled in [place] or [place]; pregnant women with a Medicaid card; and children who have applied for Medicaid or [place] Health Choice, but were declined, whose parents can pay a reduced fee at time of service.  [place] Adult Dental Access PROGRAM  [place] [phone] Patients are seen by appointment only. Walk-ins are not accepted. [place] will see patients 8 years of age and older. Monday - Tuesday (8am-5pm) Most  Wednesdays (8:30-5pm) $30 per visit, cash only  [facility] Adult Dental Access PROGRAM  [address], [facility] [phone] Patients are seen by appointment only. Walk-ins are not accepted. [place] will see patients 26 years of age and older. One Wednesday Evening (Monthly: Volunteer Based).  $30 per visit, cash only  [place]  [phone] for adults; Children under age 36, call Graduate Pediatric Dentistry at [phone]. Children aged 59-14, please call [phone] to request a pediatric application.  Dental services are provided in all areas of dental care including fillings, crowns and bridges, complete and partial dentures, implants, gum treatment, root canals, and extractions. Preventive care is also provided. Treatment is provided to both adults and children. Patients are selected via a lottery and there is often a waiting list.   [facility] [address], [place]  [phone] [email or web address]   [facility] [address] [place], [place] [phone], Ext. [phone] Second and Fourth Thursday of each month, opens at 6:30 AM; Clinic ends at 9 AM.  Patients are seen on a first-come first-served basis, and a limited number are seen during each clinic.   [facility]  [address] [place], [place] (  [phone]   Eligibility Requirements You must have lived in [place], [place], or [place] counties for at least the last three months.   You cannot be eligible for state or federal sponsored [employer], including [employer], [place], or [employer].   You generally cannot be eligible for healthcare insurance through your employer.    How to apply: Eligibility screenings are held every Tuesday and Wednesday afternoon from 1:00 pm until 4:00 pm. You do not need an appointment for the interview!  [facility] [address] [place], [place]     [place]  [place] Department  [place]  [phone]    Behavioral Health Resources in the Community: Intensive Outpatient Programs Organization         Address  Phone  Notes  [place]. [address], [place], [place] [phone]   [facility] Outpatient [address] [place], [place]   ADS: Alcohol [facility] Drug [facility] [address], [place], [place]   [place] [address],  [place], [place] or [phone]   Substance Abuse Resources Organization         Address  Phone  Notes  Alcohol and Drug Services  [phone]   [place]  [phone]   [facility] [place] [facility]   Chinita Pester  [phone]   Residential & Outpatient Substance Abuse Program  [phone]   Psychological Services Organization         Address  Phone  Notes  [facility] [place]  [place]  [phone]   [place] [address], [place] or [phone]    Mobile Crisis Teams Organization         Address  Phone  Notes  [facility], Mobile Crisis Care Unit  [phone]   [facility]  [address] [place], [place]   [place] [address], [place] [phone]    Self-Help/Support Groups Organization         Address  Phone             Notes  [place]. of [place] - variety of support groups  [place] Call for more information  Narcotics Anonymous (NA), [facility] [address], [employer] [place]  2 meetings at this location   Special educational needs teacher         Address  Phone  Notes  ASAP Residential Treatment [place],    [place]  [phone]   [facility]  [address] [place]  [address]60, [place], [place]   [place], [place] [phone] Admissions: 8am-3pm M-F  Incentives Substance [place] [address]    [place], [place] [phone]   [facility] [address] Jadene Pierini [place], [place]   [facility] [address],  [place], [place]   [facility] Programs - Intensive Outpatient [address] Kristeen [address], [place], [place]   [facility] [place].,  [place], [place] or [phone]   [facility]idential Treatment Services (RTS) [address], [place], [place] Accepts Medicaid  [facility] [place] [address]  [place] [phone] Substance Abuse/Addiction Treatment   [facility] Resources Organization  Address  Phone  Notes  [facility]  [phone]   Domenic Schwab, PhD [address] Arlis [place], [place]   [phone] or [phone]   [place], [place] [phone]   [address], [place], [place] [phone] Insurance/Medicaid/sponsorship through [facility] [address], [address] [place]                                    [place], [place] [phone] [place] [address], [place] [phone]    Dr. Adele Schilder  [phone]   [facility] Dept[facility] 1) [address] [place] 2) [place] 3)  [place] [address], [place] [phone]  [phone]   [place] [phone] or [phone] (After Hours)

## 2014-11-19 NOTE — ED Provider Notes (Signed)
CSN: [id number]     Arrival date & time 11/19/14  1230 History   First MD Initiated Contact with Patient 11/19/14 1421     Chief Complaint  Patient presents with  . Flank Pain  . Constipation     (Consider location/radiation/quality/duration/timing/severity/associated sxs/prior Treatment) HPI Patient states he's had about 2 days of intermittent cramping abdominal pain. He thought it was due to constipation from taking Valtrex. The cramping comes and goes. It migrates from one side of his abdomen to the other. This morning he had a fairly normal bowel movement and reports that the discomfort improved. Before the pain started he reports that have been about 2 days since his last bowel movement. No associated vomiting or fever. No associated urinary symptoms of pain burning or urgency.  The patient states that he was put on the Valtrex for a rash that was somewhat itchy. He states there was a little bit of it on his left forearm but now its nearly gone. He stated that time he also had a few other areas seemed itchy and he called his doctor about it. He clarifies that a number of years ago he was put on acyclovir for about 5 or 6 month because he describes a lab test that showed him to have "herpes". He denies that at that time he was having problems with a rash or symptoms. He states he doesn't think he is ever had herpes. By his description, because of this reported prior history, he was started on Valtrex for the rash that he had reported to his doctor. Past Medical History  Diagnosis Date  . Shingles   . Depression    Past Surgical History  Procedure Laterality Date  . Hemorroidectomy  2007   Family History  Problem Relation Age of Onset  . Colon cancer Neg Hx   . Esophageal cancer Neg Hx   . Rectal cancer Neg Hx   . Stomach cancer Neg Hx   . Hypertension Mother   . Heart disease Father   . Hypertension Sister    History  Substance Use Topics  . Smoking status: Current Every Day  Smoker -- 0.50 packs/day for 20 years    Types: Cigarettes  . Smokeless tobacco: Never Used  . Alcohol Use: No    Review of Systems  10 Systems reviewed and are negative for acute change except as noted in the HPI.   Allergies  Review of patient's allergies indicates no known allergies.  Home Medications   Prior to Admission medications   Medication Sig Start Date End Date Taking? Authorizing Provider  ibuprofen (ADVIL,MOTRIN) 200 MG tablet Take 200 mg by mouth as needed.     Yes Historical Provider, MD  Multiple Vitamins-Minerals (MULTIVITAMIN WITH MINERALS) tablet Take 1 tablet by mouth daily.     Yes Historical Provider, MD  polyethylene glycol (MIRALAX / GLYCOLAX) packet Take 17 g by mouth daily as needed for moderate constipation. 11/19/14   Charlesetta Shanks, MD  valACYclovir (VALTREX) 500 MG tablet Take 1 tablet (500 mg total) by mouth daily. Taking for shingles Patient not taking: Reported on 11/19/2014 11/02/14   Norman Herrlich, MD   BP 143/86 mmHg  Pulse 60  Temp(Src) 98.2 F (36.8 C) (Oral)  Resp 19  Ht 5\' 8"  (1.727 m)  Wt 150 lb (68.04 kg)  BMI 22.81 kg/m2  SpO2 100% Physical Exam  Constitutional: He is oriented to person, place, and time. He appears well-developed and well-nourished.  HENT:  Head: Normocephalic  and atraumatic.  Eyes: EOM are normal. Pupils are equal, round, and reactive to light.  Neck: Neck supple.  Cardiovascular: Normal rate, regular rhythm, normal heart sounds and intact distal pulses.   Pulmonary/Chest: Effort normal and breath sounds normal.  Abdominal: Soft. Bowel sounds are normal. He exhibits no distension. There is no tenderness.  Musculoskeletal: Normal range of motion. He exhibits no edema.  Neurological: He is alert and oriented to person, place, and time. He has normal strength. Coordination normal. GCS eye subscore is 4. GCS verbal subscore is 5. GCS motor subscore is 6.  Skin: Skin is warm, dry and intact.  Volar aspect of the left  forearm has 1, dry very small and innocuous looking papule with no associated erythema or swelling. The remainder the skin is free of any rashes. He states that area itched some a few days ago but now seems to be almost gone.  Psychiatric: He has a normal mood and affect.    ED Course  Procedures (including critical care time) Labs Review Labs Reviewed  CBC WITH DIFFERENTIAL/PLATELET - Abnormal; Notable for the following:    RBC 4.20 (*)    HCT 38.9 (*)    All other components within normal limits  COMPREHENSIVE METABOLIC PANEL  LIPASE, BLOOD  URINALYSIS, ROUTINE W REFLEX MICROSCOPIC (NOT AT [facility])    Imaging Review No results found.   EKG Interpretation None      MDM   Final diagnoses:  Generalized abdominal pain  Constipation, unspecified constipation type   By the patient's historical description of his symptoms and knowing that he is HIV negative by recent testing, as well as no history suggestive of any reason for immunosuppression or genital herpes by his description, it does not appear that the patient is in need of any type of anti-viral therapy. By his historical description, it sounds unlikely that he has had shingles. As far as the patient's abdominal pain is concerned this is waxing and waning with cramping in variable locations. There is no suggestion of surgical etiology at this time. His exam is soft and nontender. He expressed improvement after bowel movement. He is counseled on eating high-fiber foods, increase fluid intake and activity. He is given me relax and take as needed if he should develop symptoms again.    Charlesetta Shanks, MD 11/19/14 [phone]

## 2014-11-19 NOTE — ED Notes (Signed)
Pt placed in a gown and hooked up to a 5 lead, BP cuff and pulse ox

## 2014-11-19 NOTE — ED Notes (Signed)
Pt states epigastric pain that radiates to bil flank accompanied by nausea.  Pain increases with sob.  Pt also c/o constipation.

## 2014-11-19 NOTE — ED Notes (Signed)
OK to discharge with labwork pending per Dr. Johnney Killian.

## 2014-11-20 ENCOUNTER — Telehealth: Payer: Self-pay | Admitting: *Deleted

## 2014-11-20 NOTE — Telephone Encounter (Signed)
Call to [facility] spoke with Nurse there patient [facility] 03/21/[facility] positive for HSV Type 2.  Attempts had been made to contact patient for treatment without success.   Patient has not followed up with the HD.  Copy  of results to be faxed to the Clinics.  Sander Nephew, RN 11/20/2014 11:47 AM

## 2014-11-20 NOTE — Telephone Encounter (Signed)
Patient needs to be seen again prior to filling/changing this Rx as there is no clear indication for chronic therapy in the setting of recurrent shingles. 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 Further discussion and examination of the patient is necessary as well as previous health records before this medication can be refilled or changed.   Natasha Bence, MD PGY-2, Internal Medicine Pager: [phone]

## 2014-11-20 NOTE — Telephone Encounter (Signed)
Tried to call pt and both phone #'s are not working. Pharmacy informed me was denied and to have pt call clinic.

## 2015-02-23 ENCOUNTER — Encounter: Payer: Medicaid Other | Admitting: Internal Medicine

## 2015-03-05 ENCOUNTER — Encounter: Payer: Self-pay | Admitting: Cardiovascular Disease

## 2015-03-05 ENCOUNTER — Ambulatory Visit (INDEPENDENT_AMBULATORY_CARE_PROVIDER_SITE_OTHER): Payer: Self-pay | Admitting: Cardiovascular Disease

## 2015-03-05 VITALS — BP 122/80 | HR 65 | Ht 68.0 in | Wt 147.8 lb

## 2015-03-05 DIAGNOSIS — R079 Chest pain, unspecified: Secondary | ICD-10-CM

## 2015-03-05 DIAGNOSIS — Z7689 Persons encountering health services in other specified circumstances: Secondary | ICD-10-CM

## 2015-03-05 DIAGNOSIS — Z7189 Other specified counseling: Secondary | ICD-10-CM

## 2015-03-05 DIAGNOSIS — E785 Hyperlipidemia, unspecified: Secondary | ICD-10-CM

## 2015-03-05 DIAGNOSIS — R0789 Other chest pain: Secondary | ICD-10-CM

## 2015-03-05 NOTE — Assessment & Plan Note (Signed)
Neil [name] is self-referred for evaluation of atypical infrequent chest pain. Risk factors include family history with a father who had open-heart surgery at age 55, mild hyperlipidemia and tobacco abuse. The pain occurs one to 2 times a year for seconds at a time and is not activity related. I'm going to get a routine GXT to risk stratify him

## 2015-03-05 NOTE — Assessment & Plan Note (Signed)
History of mild hyperlipidemia with recent lab work performed 11/02/14 revealing total cholesterol 195, LDL 128 and HDL of 37

## 2015-03-05 NOTE — Patient Instructions (Signed)
Medication Instructions:  Your physician recommends that you continue on your current medications as directed. Please refer to the Current Medication list given to you today.   Labwork: NONE  Testing/Procedures: Your physician has requested that you have an exercise tolerance test. For further information please visit [email or web address]. Please also follow instruction sheet, as given.   Follow-Up: Follow up with Dr. Gwenlyn Found as needed.   Any Other Special Instructions Will Be Listed Below (If Applicable).

## 2015-03-05 NOTE — Progress Notes (Signed)
     03/05/2015 Neil [name]   1961-03-29  [id number]  Primary Physician Luanne Bras, MD Primary Cardiologist: Lorretta Harp MD Renae Gloss   HPI:  Mr. Neil [name] is a 54 year old single African-American male father of 5 children, grandfather and 4 grandchildren who is self-employed doing carpentry and a pulse tree. He was self-referred for evaluation of chest pain. His cardiac risk factors include a 20 pack years of tobacco abuse currently smoking one half pack per day. He has mild hyperlipidemia untreated and family history with a father who had open-heart surgery at age 57. He has never had a heart attack or stroke. He is noted atypical chest pain occurring one to 2 times a year over the last several years not associated with any activity.   Current Outpatient Prescriptions  Medication Sig Dispense Refill  . ibuprofen (ADVIL,MOTRIN) 200 MG tablet Take 200 mg by mouth as needed.      . Multiple Vitamins-Minerals (MULTIVITAMIN WITH MINERALS) tablet Take 1 tablet by mouth daily.      . polyethylene glycol (MIRALAX / GLYCOLAX) packet Take 17 g by mouth daily as needed for moderate constipation. 14 each 0   No current facility-administered medications for this visit.    No Known Allergies  Social History   Social History  . Marital Status: Single    Spouse Name: N/A  . Number of Children: N/A  . Years of Education: N/A   Occupational History  . Not on file.   Social History Main Topics  . Smoking status: Current Every Day Smoker -- 0.50 packs/day for 20 years    Types: Cigarettes  . Smokeless tobacco: Never Used  . Alcohol Use: No  . Drug Use: No  . Sexual Activity: Not on file   Other Topics Concern  . Not on file   Social History Narrative     Review of Systems: General: negative for chills, fever, night sweats or weight changes.  Cardiovascular: negative for chest pain, dyspnea on exertion, edema, orthopnea, palpitations, paroxysmal nocturnal dyspnea or  shortness of breath Dermatological: negative for rash Respiratory: negative for cough or wheezing Urologic: negative for hematuria Abdominal: negative for nausea, vomiting, diarrhea, bright red blood per rectum, melena, or hematemesis Neurologic: negative for visual changes, syncope, or dizziness All other systems reviewed and are otherwise negative except as noted above.    Blood pressure 122/80, pulse 65, height 5\' 8"  (1.727 m), weight 147 lb 12.8 oz (67.042 kg).  General appearance: alert and no distress Neck: no adenopathy, no carotid bruit, no JVD, supple, symmetrical, trachea midline and thyroid not enlarged, symmetric, no tenderness/mass/nodules Lungs: clear to auscultation bilaterally Heart: regular rate and rhythm, S1, S2 normal, no murmur, click, rub or gallop Extremities: extremities normal, atraumatic, no cyanosis or edema  EKG normal sinus rhythm the 65 without ST or T-wave changes. I personally reviewed this EKG  ASSESSMENT AND PLAN:   Chest pain [name] is self-referred for evaluation of atypical infrequent chest pain. Risk factors include family history with a father who had open-heart surgery at age 74, mild hyperlipidemia and tobacco abuse. The pain occurs one to 2 times a year for seconds at a time and is not activity related. I'm going to get a routine GXT to risk stratify him  Hyperlipidemia History of mild hyperlipidemia with recent lab work performed 11/02/14 revealing total cholesterol 195, LDL 128 and HDL of [place] MD [facility] 03/05/2015 11:27 AM

## 2015-03-16 ENCOUNTER — Ambulatory Visit (INDEPENDENT_AMBULATORY_CARE_PROVIDER_SITE_OTHER): Payer: Self-pay | Admitting: Internal Medicine

## 2015-03-16 ENCOUNTER — Ambulatory Visit (HOSPITAL_COMMUNITY)
Admission: RE | Admit: 2015-03-16 | Discharge: 2015-03-16 | Disposition: A | Discharge status: Home / Self Care | Payer: Medicaid Other | Source: Ambulatory Visit | Attending: Student in an Organized Health Care Education/Training Program | Admitting: Student in an Organized Health Care Education/Training Program

## 2015-03-16 ENCOUNTER — Encounter: Payer: Self-pay | Admitting: Internal Medicine

## 2015-03-16 VITALS — BP 123/73 | HR 58 | Temp 98.0°F | Ht 68.0 in | Wt 148.8 lb

## 2015-03-16 DIAGNOSIS — Q7649 Other congenital malformations of spine, not associated with scoliosis: Secondary | ICD-10-CM | POA: Insufficient documentation

## 2015-03-16 DIAGNOSIS — M47897 Other spondylosis, lumbosacral region: Secondary | ICD-10-CM | POA: Insufficient documentation

## 2015-03-16 DIAGNOSIS — M545 Low back pain: Secondary | ICD-10-CM

## 2015-03-16 DIAGNOSIS — M5416 Radiculopathy, lumbar region: Secondary | ICD-10-CM

## 2015-03-16 DIAGNOSIS — R3911 Hesitancy of micturition: Secondary | ICD-10-CM

## 2015-03-16 DIAGNOSIS — F17200 Nicotine dependence, unspecified, uncomplicated: Secondary | ICD-10-CM

## 2015-03-16 DIAGNOSIS — R0789 Other chest pain: Secondary | ICD-10-CM

## 2015-03-16 NOTE — Progress Notes (Signed)
   Subjective:   Patient ID: Neil [name] male   DOB: 1960-06-27 54 y.o.   MRN: [id number]  HPI: Mr. Neil [name] is a 54 y.o. male w/ PMHx of depression, tobacco abuse, and chronic atypical chest pain, presents to the clinic today for a follow-up visit. The patient's main complaint today is he wants to "get his prostate checked out". When asked specifically, he describes some mild perineal pain which he states he has had for about 1 year and also describes some mild urinary hesitancy. 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 He denies any dysuria, hematuria, increased, or urinary frequency.  Mr. Neil [name] also admits to some mid-abdominal pain that he states starts in his lumbar spine and radiates into his abdomen. He describes the pain as burning in nature, intermittent, and relatively infrequent. He also states this pain has been present for quite some time, unable to give a proper description w/ regards to timing. He also feels like he has some intermittent burning pain that extends into his right thigh. No LE weakness, numbness, saddle anesthesia, or incontinence.  Patient has also had a recent h/o intermittent atypical chest pain for which he is seeing Dr. Gwenlyn Found w/ cardiology. He is scheduled for a stress test for this given his family history and smoking history.    Current Outpatient Prescriptions  Medication Sig Dispense Refill  . ibuprofen (ADVIL,MOTRIN) 200 MG tablet Take 200 mg by mouth as needed.      . Multiple Vitamins-Minerals (MULTIVITAMIN WITH MINERALS) tablet Take 1 tablet by mouth daily.      . polyethylene glycol (MIRALAX / GLYCOLAX) packet Take 17 g by mouth daily as needed for moderate constipation. 14 each 0   No current facility-administered medications for this visit.    Review of Systems: General: Denies fever, chills, diaphoresis, appetite change and fatigue.  Respiratory: Denies SOB, DOE, cough, and wheezing.   Cardiovascular: Positive  for intermittent chest pain. Denies palpitations.  Gastrointestinal: Positive for intermittent abdominal pain. Denies nausea, vomiting, and diarrhea.  Genitourinary: Positive for intermittent perineum pain. Denies dysuria, increased frequency, and flank pain. Endocrine: Denies hot or cold intolerance, polyuria, and polydipsia. Musculoskeletal: Positive for intermittent lumbar back pain. Denies myalgias, joint swelling, arthralgias and gait problem.  Skin: Denies pallor, rash and wounds.  Neurological: Denies dizziness, seizures, syncope, weakness, lightheadedness, numbness and headaches.  Psychiatric/Behavioral: Denies mood changes, and sleep disturbances.  Objective:   Physical Exam: Filed Vitals:   03/16/15 1602  BP: 123/73  Pulse: 58  Temp: 98 F (36.7 C)  TempSrc: Oral  Height: 5\' 8"  (1.727 m)  Weight: 148 lb 12.8 oz (67.495 kg)  SpO2: 100%    General: Alert, cooperative, NAD. HEENT: PERRL, EOMI. Moist mucus membranes Neck: Full range of motion without pain, supple, no lymphadenopathy or carotid bruits Lungs: Clear to ascultation bilaterally, normal work of respiration, no wheezes, rales, rhonchi Heart: RRR, no murmurs, gallops, or rubs Abdomen: Soft, non-tender, non-distended, BS + GU: DECLINED PROSTATE EXAM.  Extremities: No cyanosis, clubbing, or edema. Positive straight leg raise negative bilaterally.  Neurologic: Alert & oriented X3, cranial nerves II-XII intact, strength grossly intact, sensation intact to light touch   Assessment & Plan:   Please see problem based assessment and plan.

## 2015-03-16 NOTE — Patient Instructions (Signed)
1. Please schedule a follow up appointment for 6 months unless you feel you need to be seen sooner.   2. Please take all medications as previously prescribed.  I will call you if your PSA or lumbar spine XRAY are abnormal.   Please follow up w/ Dr. Gwenlyn Found for your stress test.   I STRONGLY encourage you to quite smoking.   3. If you have worsening of your symptoms or new symptoms arise, please call the clinic ([phone]), or go to the ER immediately if symptoms are severe.  Smoking Cessation Quitting smoking is important to your health and has many advantages. However, it is not always easy to quit since nicotine is a very addictive drug. Oftentimes, people try 3 times or more before being able to quit. This document explains the best ways for you to prepare to quit smoking. Quitting takes hard work and a lot of effort, but you can do it. ADVANTAGES OF QUITTING SMOKING  You will live longer, feel better, and live better.  Your body will feel the impact of quitting smoking almost immediately.  Within 20 minutes, blood pressure decreases. Your pulse returns to its normal level.  After 8 hours, carbon monoxide levels in the blood return to normal. Your oxygen level increases.  After 24 hours, the chance of having a heart attack starts to decrease. Your breath, hair, and body stop smelling like smoke.  After 48 hours, damaged nerve endings begin to recover. Your sense of taste and smell improve.  After 72 hours, the body is virtually free of nicotine. Your bronchial tubes relax and breathing becomes easier.  After 2 to 12 weeks, lungs can hold more air. Exercise becomes easier and circulation improves.  The risk of having a heart attack, stroke, cancer, or lung disease is greatly reduced.  After 1 year, the risk of coronary heart disease is cut in half.  After 5 years, the risk of stroke falls to the same as a nonsmoker.  After 10 years, the risk of lung cancer is cut in half and the  risk of other cancers decreases significantly.  After 15 years, the risk of coronary heart disease drops, usually to the level of a nonsmoker.  If you are pregnant, quitting smoking will improve your chances of having a healthy baby.  The people you live with, especially any children, will be healthier.  You will have extra money to spend on things other than cigarettes. QUESTIONS TO THINK ABOUT BEFORE ATTEMPTING TO QUIT You may want to talk about your answers with your health care provider.  Why do you want to quit?  If you tried to quit in the past, what helped and what did not?  What will be the most difficult situations for you after you quit? How will you plan to handle them?  Who can help you through the tough times? Your family? Friends? A health care provider?  What pleasures do you get from smoking? What ways can you still get pleasure if you quit? Here are some questions to ask your health care provider:  How can you help me to be successful at quitting?  What medicine do you think would be best for me and how should I take it?  What should I do if I need more help?  What is smoking withdrawal like? How can I get information on withdrawal? GET READY  Set a quit date.  Change your environment by getting rid of all cigarettes, ashtrays, matches, and lighters in  your home, car, or work. Do not let people smoke in your home.  Review your past attempts to quit. Think about what worked and what did not. GET SUPPORT AND ENCOURAGEMENT You have a better chance of being successful if you have help. You can get support in many ways.  Tell your family, friends, and coworkers that you are going to quit and need their support. Ask them not to smoke around you.  Get individual, group, or telephone counseling and support. Programs are available at [employer] and health centers. Call your local health department for information about programs in your area.  Spiritual beliefs  and practices may help some smokers quit.  Download a "quit meter" on your computer to keep track of quit statistics, such as how long you have gone without smoking, cigarettes not smoked, and money saved.  Get a self-help book about quitting smoking and staying off tobacco. Liverpool yourself from urges to smoke. Talk to someone, go for a walk, or occupy your time with a task.  Change your normal routine. Take a different route to work. Drink tea instead of coffee. Eat breakfast in a different place.  Reduce your stress. Take a hot bath, exercise, or read a book.  Plan something enjoyable to do every day. Reward yourself for not smoking.  Explore interactive web-based programs that specialize in helping you quit. GET MEDICINE AND USE IT CORRECTLY Medicines can help you stop smoking and decrease the urge to smoke. Combining medicine with the above behavioral methods and support can greatly increase your chances of successfully quitting smoking.  Nicotine replacement therapy helps deliver nicotine to your body without the negative effects and risks of smoking. Nicotine replacement therapy includes nicotine gum, lozenges, inhalers, nasal sprays, and skin patches. Some may be available over-the-counter and others require a prescription.  Antidepressant medicine helps people abstain from smoking, but how this works is unknown. This medicine is available by prescription.  Nicotinic receptor partial agonist medicine simulates the effect of nicotine in your brain. This medicine is available by prescription. Ask your health care provider for advice about which medicines to use and how to use them based on your health history. Your health care provider will tell you what side effects to look out for if you choose to be on a medicine or therapy. Carefully read the information on the package. Do not use any other product containing nicotine while using a nicotine  replacement product.  RELAPSE OR DIFFICULT SITUATIONS Most relapses occur within the first 3 months after quitting. Do not be discouraged if you start smoking again. Remember, most people try several times before finally quitting. You may have symptoms of withdrawal because your body is used to nicotine. You may crave cigarettes, be irritable, feel very hungry, cough often, get headaches, or have difficulty concentrating. The withdrawal symptoms are only temporary. They are strongest when you first quit, but they will go away within 10-14 days. To reduce the chances of relapse, try to:  Avoid drinking alcohol. Drinking lowers your chances of successfully quitting.  Reduce the amount of caffeine you consume. Once you quit smoking, the amount of caffeine in your body increases and can give you symptoms, such as a rapid heartbeat, sweating, and anxiety.  Avoid smokers because they can make you want to smoke.  Do not let weight gain distract you. Many smokers will gain weight when they quit, usually less than 10 pounds. Eat a healthy diet and  stay active. You can always lose the weight gained after you quit.  Find ways to improve your mood other than smoking. FOR MORE INFORMATION  www.smokefree.gov  Document Released: 05/23/2001 Document Revised: 10/13/2013 Document Reviewed: 09/07/2011 [facility] Patient Information 2015 [place], [place]. This information is not intended to replace advice given to you by your health care provider. Make sure you discuss any questions you have with your health care provider.

## 2015-03-17 LAB — PSA: Prostate Specific Ag, Serum: 0.6 ng/mL (ref 0.0–4.0)

## 2015-03-17 NOTE — Assessment & Plan Note (Signed)
Scheduled for stress test given family history and smoking history.

## 2015-03-17 NOTE — Assessment & Plan Note (Signed)
Patient describes some perineal pain as well as mild urinary hesitancy. Declines prostate exam today. PSA checked, 0.6. Do not suspect underlying prostate etiology at this time.  -Continue to monitor clinically.

## 2015-03-17 NOTE — Assessment & Plan Note (Signed)
Patient describes some mild intermittent lumbar back pain and burning extending into his abdomen. This seems like a lumbar radiculopathy rather than a pain of abdominal etiology. Exam completely benign, no tenderness of lumbar spine, straight leg raise negative bilaterally. No tenderness of the abdomen. BS +. Lumbar spinal XR performed and personally reviewed. There does seem to be some mild disk space loss as well as degenerative changes in the lumbar spine. No acute findings.  -OTC NSAIDS for pain -Continue normal activity -If this continues to be a problem that gets worse, can consider further imaging studies to determine if there is nerve root compression.

## 2015-03-22 NOTE — Progress Notes (Signed)
Internal Medicine Clinic Attending  Case discussed with Dr. Jones soon after the resident saw the patient.  We reviewed the resident's history and exam and pertinent patient test results.  I agree with the assessment, diagnosis, and plan of care documented in the resident's note. 

## 2015-03-30 ENCOUNTER — Telehealth: Payer: Self-pay | Admitting: Internal Medicine

## 2015-03-30 NOTE — Telephone Encounter (Signed)
Message sent to Dr. Jones

## 2015-03-30 NOTE — Telephone Encounter (Signed)
Pt called requesting lab result. Please call back.

## 2015-04-01 NOTE — Telephone Encounter (Signed)
Attempt to contact pt with message from Dr. Ronnald Ramp unsuccessful.  Will try again.

## 2015-04-02 ENCOUNTER — Telehealth (HOSPITAL_COMMUNITY): Payer: Self-pay

## 2015-04-02 NOTE — Telephone Encounter (Signed)
Encounter complete. 

## 2015-04-02 NOTE — Telephone Encounter (Signed)
Left msg for patient  

## 2015-04-06 ENCOUNTER — Inpatient Hospital Stay (HOSPITAL_COMMUNITY): Admission: RE | Admit: 2015-04-06 | Payer: Medicaid Other | Source: Ambulatory Visit

## 2015-04-07 ENCOUNTER — Other Ambulatory Visit: Payer: Self-pay | Admitting: Internal Medicine

## 2015-04-07 NOTE — Telephone Encounter (Signed)
Pt requesting xray result. Please call pt back.

## 2015-04-08 ENCOUNTER — Telehealth (HOSPITAL_COMMUNITY): Payer: Self-pay

## 2015-04-08 NOTE — Telephone Encounter (Signed)
I will attempt to reach at a later time.

## 2015-04-09 ENCOUNTER — Ambulatory Visit (HOSPITAL_COMMUNITY)
Admission: RE | Admit: 2015-04-09 | Discharge: 2015-04-09 | Disposition: A | Discharge status: Home / Self Care | Payer: Self-pay | Source: Ambulatory Visit | Attending: Cardiovascular Disease | Admitting: Cardiovascular Disease

## 2015-04-09 DIAGNOSIS — R0789 Other chest pain: Secondary | ICD-10-CM | POA: Insufficient documentation

## 2015-04-09 LAB — EXERCISE TOLERANCE TEST
CSEPEDS: 0 s
CSEPEW: 10.1 METS
Exercise duration (min): 8 min
MPHR: 166 {beats}/min
Peak HR: 169 {beats}/min
Percent HR: 101 %
RPE: 16
Rest HR: 60 {beats}/min

## 2015-04-09 NOTE — Telephone Encounter (Signed)
Spoke with patient after 3 attempts to call back.  Per Dr. Ronnald Ramp, x-ray showed mild arthritis which can be managed with OTC medications such as tylenol or infrequent ibuprofen.  Also, PSA results returned as normal.  Patient understands.

## 2015-04-13 ENCOUNTER — Ambulatory Visit: Payer: Medicaid Other

## 2015-06-08 ENCOUNTER — Ambulatory Visit (INDEPENDENT_AMBULATORY_CARE_PROVIDER_SITE_OTHER): Payer: Self-pay | Admitting: Internal Medicine

## 2015-06-08 ENCOUNTER — Encounter: Payer: Self-pay | Admitting: Internal Medicine

## 2015-06-08 VITALS — BP 111/71 | HR 61 | Temp 98.0°F | Wt 154.7 lb

## 2015-06-08 DIAGNOSIS — L02223 Furuncle of chest wall: Secondary | ICD-10-CM

## 2015-06-08 DIAGNOSIS — Z0000 Encounter for general adult medical examination without abnormal findings: Secondary | ICD-10-CM

## 2015-06-08 DIAGNOSIS — F2089 Other schizophrenia: Secondary | ICD-10-CM

## 2015-06-08 DIAGNOSIS — L02229 Furuncle of trunk, unspecified: Secondary | ICD-10-CM | POA: Insufficient documentation

## 2015-06-08 DIAGNOSIS — F251 Schizoaffective disorder, depressive type: Secondary | ICD-10-CM

## 2015-06-08 DIAGNOSIS — L723 Sebaceous cyst: Secondary | ICD-10-CM

## 2015-06-08 DIAGNOSIS — Z23 Encounter for immunization: Secondary | ICD-10-CM

## 2015-06-08 NOTE — Assessment & Plan Note (Signed)
Given Tdap today, refused flu shot.

## 2015-06-08 NOTE — Assessment & Plan Note (Addendum)
Has had this issue in the past. Suspect this is a healing boil, directly above the xyphoid process. Says it popped on its own. No systemic symptoms, no surrounding erythema, no drainage. Still a very small area of fluctuance, suspect this will simply resolve on its own.  -Do not feel he requires antibiotics based on the appearance.  -Instructed patient to return if the area gets worse

## 2015-06-08 NOTE — Assessment & Plan Note (Signed)
Says he is taking 3 medications for his schizophrenia and depression, although he cannot remember the names. Did not bring these with him today.  -Obtain records from [facility] -Encouraged patient to bring meds with him to his next visit

## 2015-06-08 NOTE — Assessment & Plan Note (Signed)
Actually has bilateral sebaceous cysts, ~ 1 cm x 1 cm in size, the left slightly larger than the right. Both are hard, non-tender, mobile, and have apparently been there for quite some time. To superficial to be LN, to firm to be lipoma. No surrounding redness, increased warmth, or drainage.  -Referral to dermatology for removal.

## 2015-06-08 NOTE — Patient Instructions (Signed)
1. Please make a follow up in 6 months.   We will set up an appointment with a dermatologist for your sebaceous cysts.   2. Please take all medications as previously prescribed.  3. If you have worsening of your symptoms or new symptoms arise, please call the clinic [phone] or go to the ER immediately if symptoms are severe.  You have done a great job in taking all your medications. Please continue to do this.   Sebaceous Cyst Removal Sebaceous cyst removal is a procedure to remove a sac of oily material that forms under your skin (sebaceous cyst). Sebaceous cysts may also be called epidermoid cysts or keratin cysts. Normally, the skin secretes this oily material through a gland or a hair follicle. This type of cyst usually results when a skin gland or hair follicle becomes blocked. You may need this procedure if you have a sebaceous cyst that becomes large, uncomfortable, or infected. LET [facility] CARE PROVIDER KNOW ABOUT:  Any allergies you have.  All medicines you are taking, including vitamins, herbs, eye drops, creams, and over-the-counter medicines.  Previous problems you or members of your family have had with the use of anesthetics.  Any blood disorders you have.  Previous surgeries you have had.  Medical conditions you have. RISKS AND COMPLICATIONS Generally, this is a safe procedure. However, problems may occur, including:  Developing another cyst.  Bleeding.  Infection.  Scarring. BEFORE THE PROCEDURE  Ask your health care provider about:  Changing or stopping your regular medicines. This is especially important if you are taking diabetes medicines or blood thinners.  Taking medicines such as aspirin and ibuprofen. These medicines can thin your blood. Do not take these medicines before your procedure if your health care provider instructs you not to.  If you have an infected cyst, you may have to take antibiotic medicines before or after the cyst  removal. Take your antibiotics as directed by your health care provider. Finish all of the medicine even if you start to feel better.  Take a shower on the morning of your procedure. Your health care provider may ask you to use a germ-killing (antiseptic) soap. PROCEDURE  You will be given a medicine that numbs the area (local anesthetic).  The skin around the cyst will be cleaned with a germ-killing solution (antiseptic).  Your health care provider will make a small surgical incision over the cyst.  The cyst will be separated from the surrounding tissues that are under your skin.  If possible, the cyst will be removed undamaged (intact).  If the cyst bursts (ruptures), it will need to be removed in pieces.  After the cyst is removed, your health care provider will control any bleeding and close the incision with small stitches (sutures). Small incisions may not need sutures, and the bleeding will be controlled by applying direct pressure with gauze.  Your health care provider may apply antibiotic ointment and a light bandage (dressing) over the incision. This procedure may vary among health care providers and hospitals. AFTER THE PROCEDURE  If your cyst ruptured during surgery, you may need to take antibiotic medicine. If you were prescribed an antibiotic medicine, finish all of it even if you start to feel better.   This information is not intended to replace advice given to you by your health care provider. Make sure you discuss any questions you have with your health care provider.   Document Released: 05/26/2000 Document Revised: 06/19/2014 Document Reviewed: 02/11/2014 Elsevier Interactive Patient  Education 2016 [employer].

## 2015-06-08 NOTE — Progress Notes (Signed)
   Subjective:   Patient ID: Neil [name] male   DOB: [birth year] 54 y.o.   MRN: [id number]  HPI: Neil [name] is a 54 y.o. male w/ PMHx of depression, tobacco abuse, ?schizophrenia, and chronic atypical chest pain, presents to the clinic today for an acute visit for a chest boil. Patient states he has had this issue in the past and has required I&D before. Today, he states the boil is improved significantly from before, says it popped on its own. Denies any systemic symptoms of infection. No surrounding erythema, tenderness, or drainage. Also notes 2 bumps in axilla bilaterally, non-tender, have been present for quite some time according to the patient. No discharge.   Current Outpatient Prescriptions  Medication Sig Dispense Refill  . ibuprofen (ADVIL,MOTRIN) 200 MG tablet Take 200 mg by mouth as needed.      . Multiple Vitamins-Minerals (MULTIVITAMIN WITH MINERALS) tablet Take 1 tablet by mouth daily.      . polyethylene glycol (MIRALAX / GLYCOLAX) packet Take 17 g by mouth daily as needed for moderate constipation. 14 each 0   No current facility-administered medications for this visit.    Review of Systems  General: Denies fever, diaphoresis, appetite change, and fatigue.  Respiratory: Denies SOB, cough, and wheezing.   Cardiovascular: Denies chest pain and palpitations.  Gastrointestinal: Denies nausea, vomiting, abdominal pain, and diarrhea Musculoskeletal: Denies myalgias, arthralgias, back pain, and gait problem.  Neurological: Denies dizziness, syncope, weakness, lightheadedness, and headaches.  Psychiatric/Behavioral: Denies mood changes, sleep disturbance, and agitation.   Objective:   Physical Exam: Filed Vitals:   06/08/15 1427  BP: 111/71  Pulse: 61  Temp: 98 F (36.7 C)  TempSrc: Oral  Weight: 154 lb 11.2 oz (70.171 kg)  SpO2: 100%   General: Alert, cooperative, NAD. HEENT: PERRL, EOMI. Moist mucus membranes Neck: Full range of motion without pain, supple,  no lymphadenopathy or carotid bruits Lungs: Clear to ascultation bilaterally, normal work of respiration, no wheezes, rales, rhonchi Heart: RRR, no murmurs, gallops, or rubs Abdomen: Soft, non-tender, non-distended, BS + Extremities: No cyanosis, clubbing, or edema Skin: Healing central chest boil, just superior to the xyphoid process. No surrounding erythema, no drainage, or tenderness. Very small remaining area of fluctuance. Bilateral axillary sebaceous cysts. Hard, non-tender, mobile, no drainage or erythema. ~1 cm x 1 cm bilaterally, anterior portion of the axilla, left slightly larger than the right.  Neurologic: Alert & oriented x3, cranial nerves II-XII intact, strength grossly intact, sensation intact to light touch    Assessment & Plan:   Please see problem based assessment and plan.

## 2015-06-09 NOTE — Progress Notes (Signed)
Internal Medicine Clinic Attending  Case discussed with Dr. Jones at the time of the visit.  We reviewed the resident's history and exam and pertinent patient test results.  I agree with the assessment, diagnosis, and plan of care documented in the resident's note.  

## 2015-06-16 ENCOUNTER — Ambulatory Visit: Payer: Medicaid Other

## 2015-07-07 ENCOUNTER — Telehealth: Payer: Self-pay | Admitting: *Deleted

## 2015-07-07 NOTE — Telephone Encounter (Signed)
SPOKE WITH PATIENT, APPOINTMENT FOR DERMATOLOGY GIVEN. PATIENT IS AWARE OF THIS APPOINTMENT.

## 2015-10-11 ENCOUNTER — Telehealth: Payer: Self-pay | Admitting: Internal Medicine

## 2015-10-11 NOTE — Telephone Encounter (Signed)
APPT. REMINDER CALL, LMTCB °

## 2015-10-12 ENCOUNTER — Encounter: Payer: Medicaid Other | Admitting: Internal Medicine

## 2015-11-02 ENCOUNTER — Emergency Department (HOSPITAL_COMMUNITY): Payer: Medicaid Other

## 2015-11-02 ENCOUNTER — Emergency Department (HOSPITAL_COMMUNITY)
Admission: EM | Admit: 2015-11-02 | Discharge: 2015-11-02 | Disposition: A | Discharge status: Home / Self Care | Payer: Medicaid Other | Attending: Emergency Medicine | Admitting: Emergency Medicine

## 2015-11-02 ENCOUNTER — Encounter (HOSPITAL_COMMUNITY): Payer: Self-pay | Admitting: *Deleted

## 2015-11-02 DIAGNOSIS — R0789 Other chest pain: Secondary | ICD-10-CM | POA: Insufficient documentation

## 2015-11-02 DIAGNOSIS — R079 Chest pain, unspecified: Secondary | ICD-10-CM | POA: Diagnosis present

## 2015-11-02 DIAGNOSIS — Z79899 Other long term (current) drug therapy: Secondary | ICD-10-CM | POA: Diagnosis not present

## 2015-11-02 DIAGNOSIS — F17210 Nicotine dependence, cigarettes, uncomplicated: Secondary | ICD-10-CM | POA: Diagnosis not present

## 2015-11-02 DIAGNOSIS — F329 Major depressive disorder, single episode, unspecified: Secondary | ICD-10-CM | POA: Diagnosis not present

## 2015-11-02 LAB — BASIC METABOLIC PANEL
Anion gap: 6 (ref 5–15)
BUN: 15 mg/dL (ref 6–20)
CALCIUM: 9.2 mg/dL (ref 8.9–10.3)
CO2: 26 mmol/L (ref 22–32)
Chloride: 107 mmol/L (ref 101–111)
Creatinine, Ser: 1.02 mg/dL (ref 0.61–1.24)
GLUCOSE: 101 mg/dL — AB (ref 65–99)
Potassium: 3.6 mmol/L (ref 3.5–5.1)
SODIUM: 139 mmol/L (ref 135–145)

## 2015-11-02 LAB — CBC
HCT: 35.7 % — ABNORMAL LOW (ref 39.0–52.0)
Hemoglobin: 11.7 g/dL — ABNORMAL LOW (ref 13.0–17.0)
MCH: 30.8 pg (ref 26.0–34.0)
MCHC: 32.8 g/dL (ref 30.0–36.0)
MCV: 93.9 fL (ref 78.0–100.0)
Platelets: 350 10*3/uL (ref 150–400)
RBC: 3.8 MIL/uL — ABNORMAL LOW (ref 4.22–5.81)
RDW: 14.9 % (ref 11.5–15.5)
WBC: 5.2 10*3/uL (ref 4.0–10.5)

## 2015-11-02 LAB — I-STAT TROPONIN, ED
Troponin i, poc: 0 ng/mL (ref 0.00–0.08)
Troponin i, poc: 0 ng/mL (ref 0.00–0.08)

## 2015-11-02 MED ORDER — NAPROXEN 250 MG PO TABS: 250.0000 mg | ORAL_TABLET | Freq: Two times a day (BID) | ORAL | Status: DC

## 2015-11-02 NOTE — Discharge Instructions (Signed)

## 2015-11-02 NOTE — ED Provider Notes (Signed)
CSN: [id number]     Arrival date & time 11/02/15  1316 History   First MD Initiated Contact with Patient 11/02/15 1531     Chief Complaint  Patient presents with  . Chest Pain    Neil [name] is a 55 y.o. male who presents to the emergency department complaining of left-sided nonradiating chest pain intermittently since yesterday. Patient reports he's had intermittent pain since last night which became constant around 5 am this morning, or about 10 hours prior to my evaluation. He currently complains of 5 out of 10 throbbing and nonradiating left-sided chest pain. He reports his pain is worse with touching the area and with movement of his left arm. He is taking nothing for treatment of his symptoms today. He denies any shortness of breath. He denies other aggravating factors. He denies alleviating factors. He is a smoker. He has a history of hyperlipidemia on his medical record. He reports his father had a heart attack at the age of 57. The patient denies history of hypertension. He denies fevers, coughing, shortness of breath, chest tightness, wheezing, abdominal pain, nausea, vomiting, diarrhea, numbness, weakness, neck pain, leg pain, leg swelling, palpitations.   Patient is a 55 y.o. male presenting with chest pain. The history is provided by the patient. No language interpreter was used.  Chest Pain Associated symptoms: no abdominal pain, no back pain, no cough, no dizziness, no fever, no headache, no nausea, no numbness, no palpitations, no shortness of breath, not vomiting and no weakness     Past Medical History  Diagnosis Date  . Shingles   . Depression   . Tobacco abuse   . Hyperlipidemia   . Chest pain    Past Surgical History  Procedure Laterality Date  . Hemorroidectomy  2007   Family History  Problem Relation Age of Onset  . Colon cancer Neg Hx   . Esophageal cancer Neg Hx   . Rectal cancer Neg Hx   . Stomach cancer Neg Hx   . Hypertension Mother   . Heart failure  Father   . Unexplained death Maternal Grandmother   . Unexplained death Maternal Grandfather   . Unexplained death Paternal Grandmother   . Unexplained death Paternal Grandfather    Social History  Substance Use Topics  . Smoking status: Current Every Day Smoker -- 0.50 packs/day for 20 years    Types: Cigarettes  . Smokeless tobacco: Never Used  . Alcohol Use: No    Review of Systems  Constitutional: Negative for fever and chills.  HENT: Negative for congestion and sore throat.   Eyes: Negative for visual disturbance.  Respiratory: Negative for cough, chest tightness, shortness of breath and wheezing.   Cardiovascular: Positive for chest pain. Negative for palpitations and leg swelling.  Gastrointestinal: Negative for nausea, vomiting, abdominal pain and diarrhea.  Genitourinary: Negative for dysuria.  Musculoskeletal: Negative for back pain and neck pain.  Skin: Negative for rash.  Neurological: Negative for dizziness, weakness, light-headedness, numbness and headaches.      Allergies  Review of patient's allergies indicates no known allergies.  Home Medications   Prior to Admission medications   Medication Sig Start Date End Date Taking? Authorizing Provider  Hyprom-Naphaz-Polysorb-Zn Sulf (CLEAR EYES COMPLETE OP) Apply 1 drop to eye 3 (three) times daily.   Yes Historical Provider, MD  Multiple Vitamins-Minerals (MULTIVITAMIN WITH MINERALS) tablet Take 1 tablet by mouth daily.     Yes Historical Provider, MD  naproxen (NAPROSYN) 250 MG tablet Take 1 tablet (  250 mg total) by mouth 2 (two) times daily with a meal. 11/02/15   Waynetta Pean, PA-C  polyethylene glycol (MIRALAX / GLYCOLAX) packet Take 17 g by mouth daily as needed for moderate constipation. Patient not taking: Reported on 11/02/2015 11/19/14   Charlesetta Shanks, MD   BP 152/95 mmHg  Pulse 53  Temp(Src) 98 F (36.7 C) (Oral)  Resp 18  SpO2 100% Physical Exam  Constitutional: He appears well-developed and  well-nourished. No distress.  Nontoxic appearing.  HENT:  Head: Normocephalic and atraumatic.  Mouth/Throat: Oropharynx is clear and moist.  Eyes: Conjunctivae are normal. Pupils are equal, round, and reactive to light. Right eye exhibits no discharge. Left eye exhibits no discharge.  Neck: Normal range of motion. Neck supple. No JVD present. No tracheal deviation present.  Cardiovascular: Normal rate, regular rhythm, normal heart sounds and intact distal pulses.  Exam reveals no gallop and no friction rub.   No murmur heard. Bilateral radial, posterior tibialis and dorsalis pedis pulses are intact.    Pulmonary/Chest: Effort normal and breath sounds normal. No stridor. No respiratory distress. He has no wheezes. He has no rales. He exhibits tenderness.  Lungs clear to auscultation bilaterally. Symmetric chest expansion bilaterally. Patient has left-sided chest wall tenderness to palpation which reproduces his chest pain.  Abdominal: Soft. There is no tenderness. There is no guarding.  Musculoskeletal: He exhibits no edema or tenderness.  No lower extremity edema or tenderness.  Lymphadenopathy:    He has no cervical adenopathy.  Neurological: He is alert. Coordination normal.  Skin: Skin is warm and dry. No rash noted. He is not diaphoretic. No erythema. No pallor.  Psychiatric: He has a normal mood and affect. His behavior is normal.  Nursing note and vitals reviewed.   ED Course  Procedures (including critical care time) Labs Review Labs Reviewed  BASIC METABOLIC PANEL - Abnormal; Notable for the following:    Glucose, Bld 101 (*)    All other components within normal limits  CBC - Abnormal; Notable for the following:    RBC 3.80 (*)    Hemoglobin 11.7 (*)    HCT 35.7 (*)    All other components within normal limits  I-STAT TROPOININ, ED  Randolm Idol, ED    Imaging Review Dg Chest 2 View  11/02/2015  CLINICAL DATA:  Left-sided chest pain for 2 days, initial encounter  EXAM: CHEST  2 VIEW COMPARISON:  12/29/2005 FINDINGS: The heart size and mediastinal contours are within normal limits. Both lungs are clear. The visualized skeletal structures are unremarkable. IMPRESSION: No active cardiopulmonary disease. Electronically Signed   By: Inez Catalina M.D.   On: 11/02/2015 14:17   I have personally reviewed and evaluated these images and lab results as part of my medical decision-making.   EKG Interpretation   Date/Time:  Tuesday Nov 02 2015 13:37:38 EDT Ventricular Rate:  55 PR Interval:  162 QRS Duration: 92 QT Interval:  432 QTC Calculation: 413 R Axis:   87 Text Interpretation:  Sinus rhythm Left ventricular hypertrophy Baseline  wander in lead(s) I III aVL Confirmed by Lita Mains  MD, DAVID ([id number]) on  11/02/2015 6:09:39 PM      Filed Vitals:   11/02/15 1602 11/02/15 1825  BP: 152/95   Pulse: 53   Temp:  98 F (36.7 C)  TempSrc:  Oral  Resp: 18   SpO2: 100%      MDM   Meds given in ED:  Medications - No data to display  New Prescriptions   NAPROXEN (NAPROSYN) 250 MG TABLET    Take 1 tablet (250 mg total) by mouth 2 (two) times daily with a meal.    Final diagnoses:  Chest pain, unspecified chest pain type   This is a 55 y.o. male who presents to the emergency department complaining of left-sided nonradiating chest pain intermittently since yesterday. Patient reports he's had intermittent pain since last night which became constant around 5 am this morning, or about 10 hours prior to my evaluation. He currently complains of 5 out of 10 throbbing and nonradiating left-sided chest pain. He reports his pain is worse with touching the area and with movement of his left arm. He is taking nothing for treatment of his symptoms today. He denies any shortness of breath. He denies other aggravating factors. He denies alleviating factors. He is a smoker. He has a history of hyperlipidemia on his medical record. He reports his father had a heart attack  at the age of 86. The patient denies history of hypertension. He denies fevers, coughing, shortness of breath. The patient had a low risk exercise stress test 03/2015. Patient presented with chest pain to the ED. Patient is to be discharged with recommendation to follow up with PCP in regards to today's hospital visit. Chest pain is not likely of cardiac or pulmonary etiology due to presentation, Wells' negative, VSS, no tracheal deviation, no JVD or new murmur, RRR, breath sounds equal bilaterally, EKG without acute abnormalities, negative troponin and delta troponin, and negative CXR. HEART score is 3. Patient has been advised to return to the ED if chest pain becomes exertional, associated with diaphoresis or nausea, radiates to left jaw/arm, worsens or becomes concerning in any way. Patient appears reliable for follow up and is agreeable to discharge. I advised the patient to follow-up with their primary care provider and cardiologist this week. I advised the patient to return to the emergency department with new or worsening symptoms or new concerns. The patient verbalized understanding and agreement with plan.       Waynetta Pean, PA-C 11/02/15 1830  Julianne Rice, MD 11/02/15 2215

## 2015-11-02 NOTE — ED Notes (Signed)
Pt reports he was lifting heavy objects yesterday at work, reports pain under his L breast.  Worse when moving his L arm up.  Denies any other sxs at this time.  Pt reports his father died of a heart attack when he was 55yo.

## 2015-11-22 ENCOUNTER — Encounter: Payer: Self-pay | Admitting: *Deleted

## 2016-04-05 ENCOUNTER — Emergency Department (HOSPITAL_COMMUNITY): Payer: Medicaid Other

## 2016-04-05 ENCOUNTER — Emergency Department (HOSPITAL_COMMUNITY)
Admission: EM | Admit: 2016-04-05 | Discharge: 2016-04-05 | Disposition: A | Discharge status: Home / Self Care | Payer: Medicaid Other | Attending: Emergency Medicine | Admitting: Emergency Medicine

## 2016-04-05 DIAGNOSIS — S069X9A Unspecified intracranial injury with loss of consciousness of unspecified duration, initial encounter: Secondary | ICD-10-CM | POA: Insufficient documentation

## 2016-04-05 DIAGNOSIS — F17210 Nicotine dependence, cigarettes, uncomplicated: Secondary | ICD-10-CM | POA: Insufficient documentation

## 2016-04-05 DIAGNOSIS — M25512 Pain in left shoulder: Secondary | ICD-10-CM | POA: Diagnosis not present

## 2016-04-05 DIAGNOSIS — Y999 Unspecified external cause status: Secondary | ICD-10-CM | POA: Diagnosis not present

## 2016-04-05 DIAGNOSIS — M25511 Pain in right shoulder: Secondary | ICD-10-CM | POA: Diagnosis not present

## 2016-04-05 DIAGNOSIS — Y92410 Unspecified street and highway as the place of occurrence of the external cause: Secondary | ICD-10-CM | POA: Insufficient documentation

## 2016-04-05 DIAGNOSIS — Y939 Activity, unspecified: Secondary | ICD-10-CM | POA: Diagnosis not present

## 2016-04-05 DIAGNOSIS — M542 Cervicalgia: Secondary | ICD-10-CM | POA: Insufficient documentation

## 2016-04-05 MED ORDER — CYCLOBENZAPRINE HCL 5 MG PO TABS: 5.0000 mg | ORAL_TABLET | Freq: Three times a day (TID) | ORAL | 0 refills | Status: DC | PRN

## 2016-04-05 MED ORDER — NAPROXEN 250 MG PO TABS
500.0000 mg | ORAL_TABLET | Freq: Once | ORAL | Status: AC
  Administered 2016-04-05: 500 mg via ORAL
  Filled 2016-04-05: qty 2

## 2016-04-05 MED ORDER — NAPROXEN 500 MG PO TABS: 500.0000 mg | ORAL_TABLET | Freq: Two times a day (BID) | ORAL | 0 refills | Status: DC

## 2016-04-05 MED ORDER — CYCLOBENZAPRINE HCL 10 MG PO TABS
10.0000 mg | ORAL_TABLET | Freq: Once | ORAL | Status: AC
  Administered 2016-04-05: 10 mg via ORAL
  Filled 2016-04-05: qty 1

## 2016-04-05 NOTE — ED Notes (Signed)
Reported to Gay Filler, PA that pt. Is ready to go .

## 2016-04-05 NOTE — ED Notes (Signed)
Pt. Just returned from x-ray
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
## 2016-04-05 NOTE — ED Notes (Signed)
Pt was able to ambulate in hallway with no assistance. Pt states that his right leg is a little sore, but it is okay. Pt back in bed and placed back on monitor at this time.

## 2016-04-05 NOTE — Discharge Instructions (Signed)
Read the information below.  Your CT head showed what could be a benign mass at your pituitary gland. It is important that you have follow up imaging completed outpatient. I have provided the contact information for neurosurgery. Please call for further evaluation.  The rest of your imaging was re-assuring.  You may feel sore for the next 2-3 days. I have prescribed naprosyn and flexeril. While taking naprosyn do not take other NSAIDs (ibuprofen, motrin, aleeve). Flexeril can make you drowsy, do not drive after taking. You can apply heat/ice to affected areas.  Follow up with your primary doctor if your symptoms persist for more than 5 days.  Use the prescribed medication as directed.  Please discuss all new medications with your pharmacist.   You may return to the Emergency Department at any time for worsening condition or any new symptoms that concern you.
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
## 2016-05-15 ENCOUNTER — Ambulatory Visit (INDEPENDENT_AMBULATORY_CARE_PROVIDER_SITE_OTHER): Payer: Self-pay | Admitting: Internal Medicine

## 2016-05-15 ENCOUNTER — Encounter: Payer: Self-pay | Admitting: Internal Medicine

## 2016-05-15 VITALS — BP 124/82 | HR 76 | Resp 12 | Ht 67.0 in | Wt 157.0 lb

## 2016-05-15 DIAGNOSIS — K029 Dental caries, unspecified: Secondary | ICD-10-CM | POA: Diagnosis not present

## 2016-05-15 NOTE — Progress Notes (Signed)
   Subjective:    Patient ID: Neil [name], male    DOB: 1960-08-07, 55 y.o.   MRN: [id number]  HPI   Here to establish  1.  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  Has been seen in follow up with Dr. Newman Pies, NS on 04/25/2016. Had prolactin, TSH, Am Cortisol, Growth Hormone.  Also states had MRI.  Results are still pending.   Is to be seen at [facility], Dr. Satira Sark on 07/11/2015.  2.  Health Maintenance:  He wants a physical to evaluate prostate and prevention of colon CA.  3.  Bipolar Disorder and schizophrenia diagnosed age 26 or 55 yo.  Taking Trintellix 10 mg and Invega 9 mg at bedtime.  Followed at [facility].  Counselor or caregiver is Othelia Pulling.  Feels he is fairly well controlled.  He is in process of working toward disability. Does have auditory hallucinations.  Has this fairly regularly.  Voices do not tell him to harm himself or someone else  Current Meds  Medication Sig  . paliperidone (INVEGA) 9 MG 24 hr tablet Take 9 mg by mouth every morning.  . vortioxetine HBr (TRINTELLIX) 10 MG TABS Take by mouth as needed.   No Known Allergies   Past Medical History:  Diagnosis Date  . Bipolar disorder [facility]) age 22 yo  . Chest pain   . Hyperlipidemia   . Schizophrenia [facility] age 22 yo  . 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  . Tobacco abuse    Past Surgical History:  Procedure Laterality Date  . HEMORROIDECTOMY  2007    Family History  Problem Relation Age of Onset  . Hypertension Mother   . Stroke Mother   . Mental retardation Mother   . Heart failure Father   . Hypertension Sister   . Unexplained death Maternal Grandmother   . Unexplained death Maternal Grandfather   . Unexplained death Paternal Grandmother   . Unexplained death Paternal Grandfather   . Bipolar  disorder Son   . Schizophrenia Son        Review of Systems     Objective:   Physical Exam  HEENT:  PERRL, EOMI, TMs pearly gray, throat without injection.  Lot of dental work and missing teeth. Neck:  Supple, No adenopathy, no thyromegaly Chest:  CTA CV:  RRR with normal S1 and S2, No S3, S4 or murmur, radial and DP pulses normal and equal Abd:  S, NT, No HSM or mass, + BS       Assessment & Plan:  1.  Dental decay:  Needs orange card for dental.   2.  HM:  Refuses influenza.  Return in 3 months for CPE with fasting labs.  3.  Mental Health:  Release for records from [facility] to clarify definitive diagnoses and caregivers.  4.  Tobacco Abuse:  Address further at CPE

## 2016-06-08 IMAGING — DX DG CHEST 2V
2 series · 2 of 2 positions shown · non-contrast
Comparison: 12/29/2005

CLINICAL DATA: Left-sided chest pain for 2 days, initial encounter

EXAM:
CHEST  2 VIEW

[chest lat]
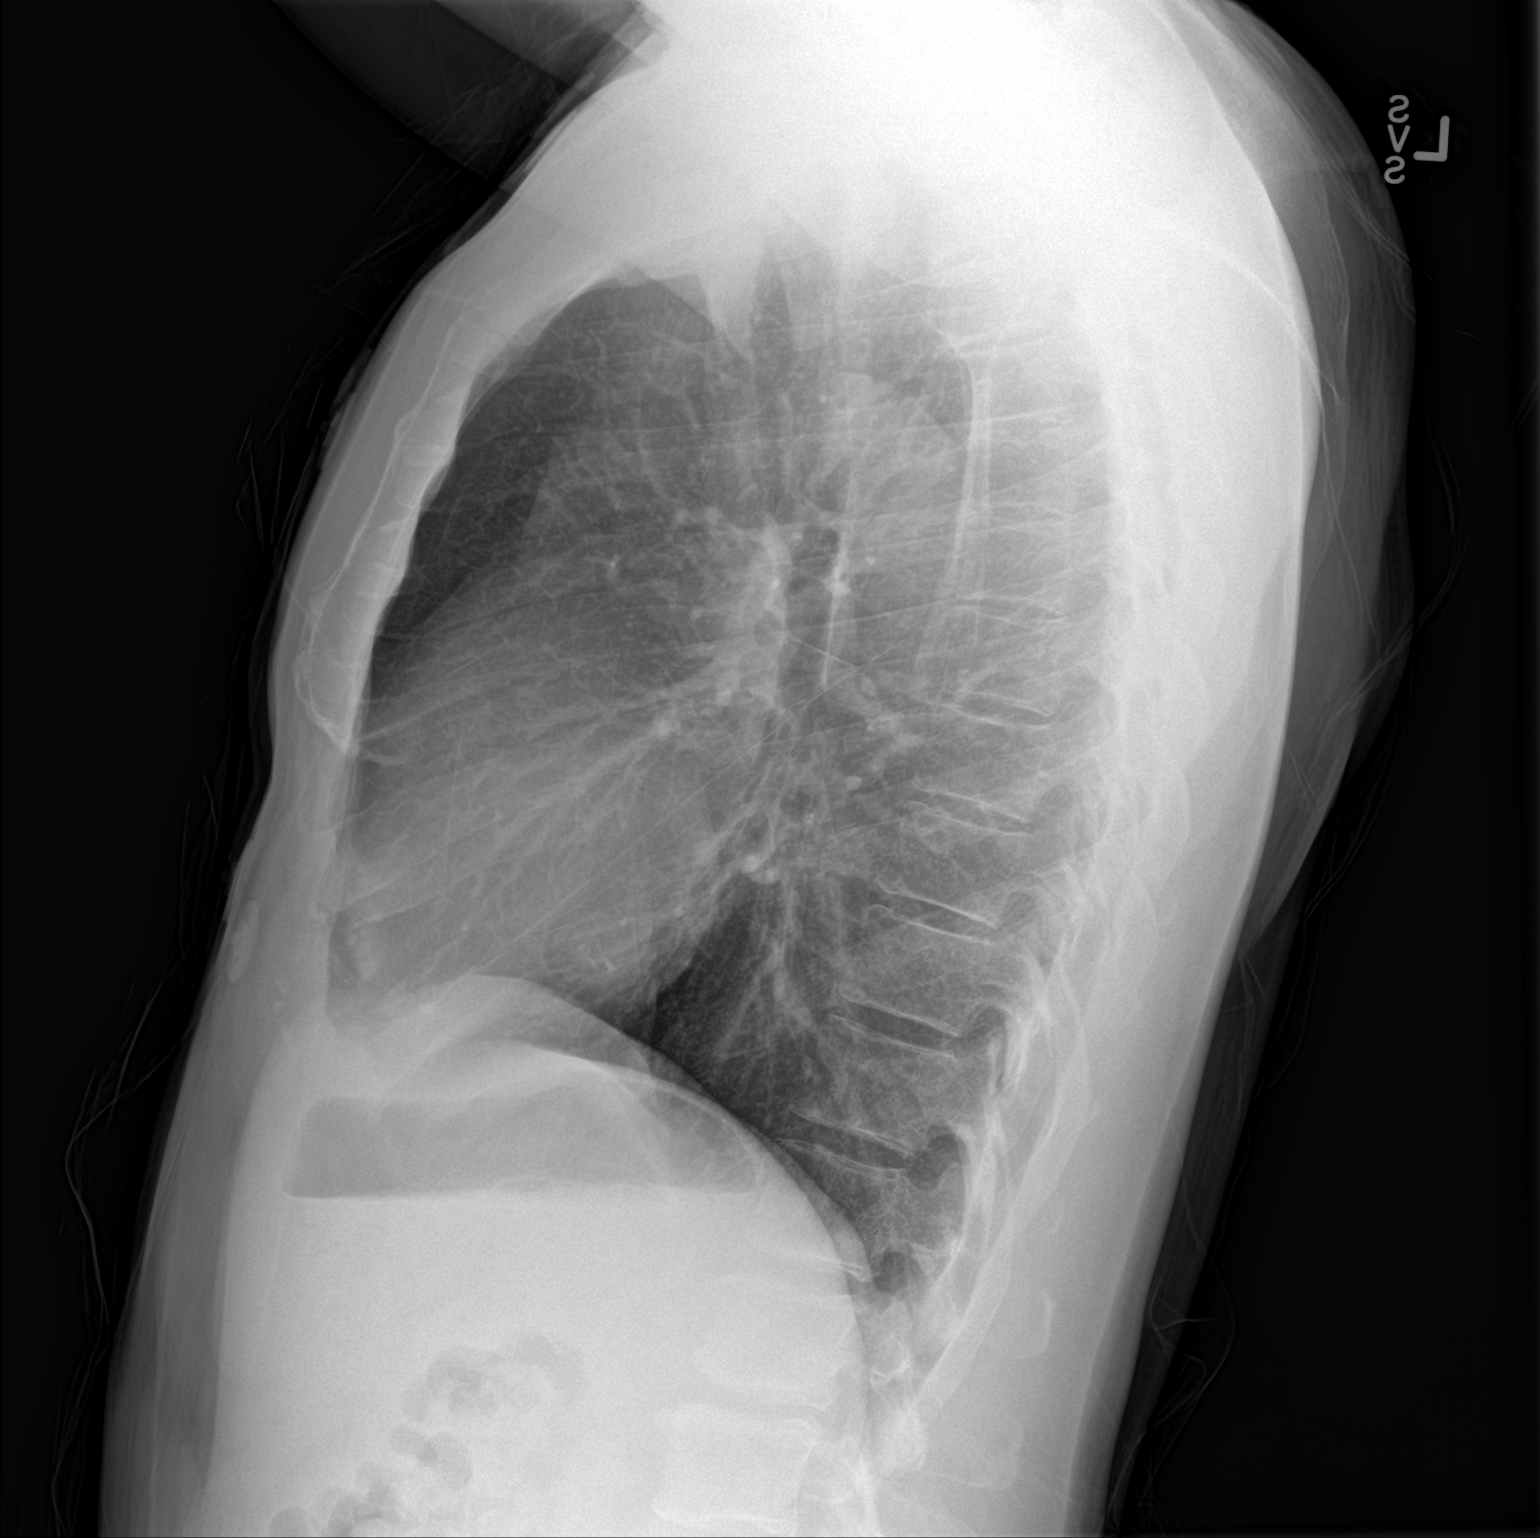

[chest pa]
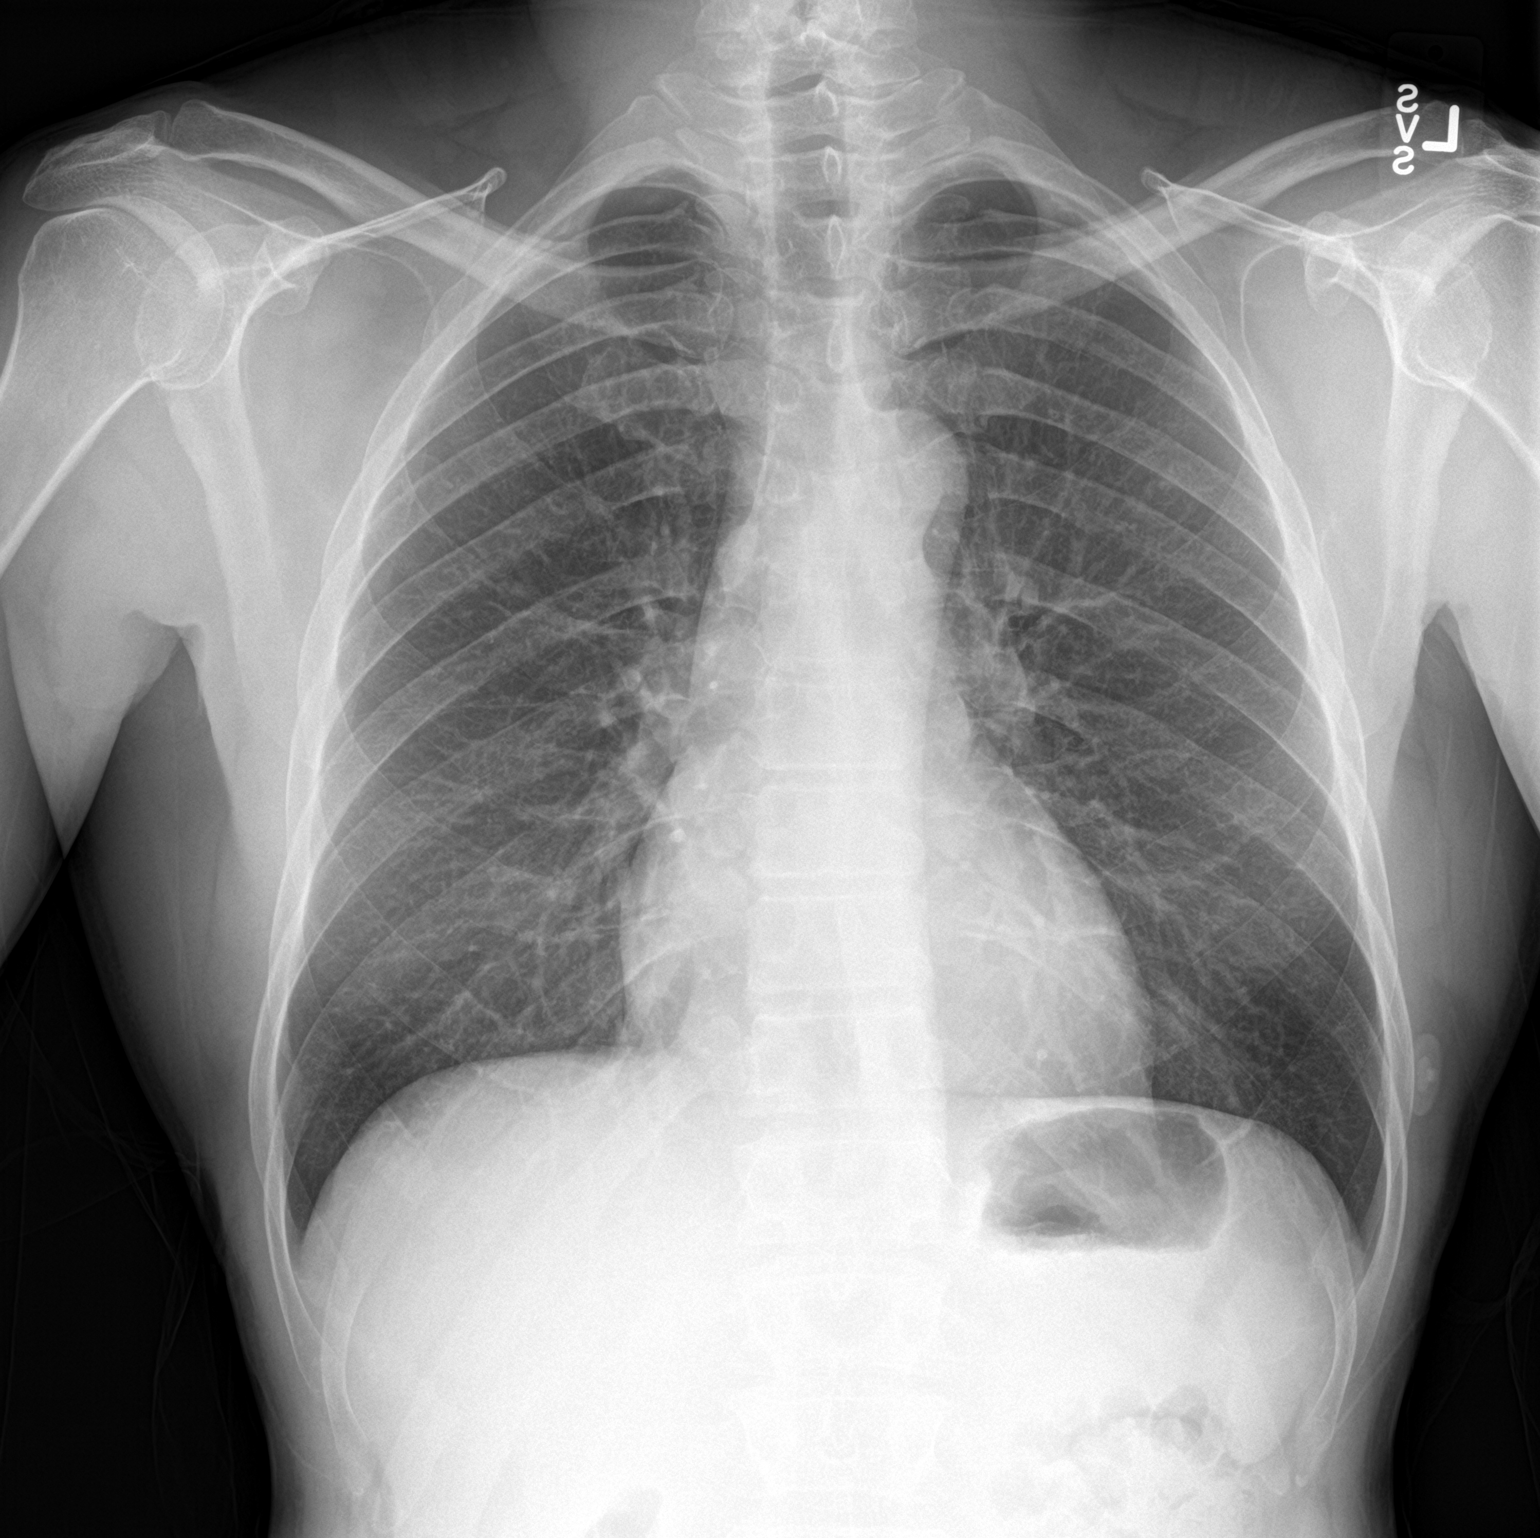

[2 of 2 positions shown; findings below may reference images not displayed]

FINDINGS: The heart size and mediastinal contours are within normal limits.
Both lungs are clear. The visualized skeletal structures are
unremarkable.
IMPRESSION: No active cardiopulmonary disease.

## 2016-06-21 ENCOUNTER — Telehealth: Payer: Self-pay | Admitting: Internal Medicine

## 2016-06-21 ENCOUNTER — Encounter: Payer: Self-pay | Admitting: Internal Medicine

## 2016-06-23 NOTE — Telephone Encounter (Signed)
Left message for patient to let us know if he would like list mailed or if he would like to pick up list at the office.

## 2016-06-27 NOTE — Telephone Encounter (Signed)
Mailed out list for [facility] dentist

## 2016-08-11 ENCOUNTER — Ambulatory Visit: Payer: Medicaid Other | Admitting: Internal Medicine

## 2016-08-21 ENCOUNTER — Ambulatory Visit (INDEPENDENT_AMBULATORY_CARE_PROVIDER_SITE_OTHER): Payer: Medicaid Other | Admitting: Internal Medicine

## 2016-08-21 ENCOUNTER — Encounter: Payer: Self-pay | Admitting: Internal Medicine

## 2016-08-21 VITALS — BP 128/84 | HR 70 | Resp 12 | Ht 68.0 in | Wt 165.0 lb

## 2016-08-21 DIAGNOSIS — Z125 Encounter for screening for malignant neoplasm of prostate: Secondary | ICD-10-CM

## 2016-08-21 DIAGNOSIS — E785 Hyperlipidemia, unspecified: Secondary | ICD-10-CM

## 2016-08-21 DIAGNOSIS — Z720 Tobacco use: Secondary | ICD-10-CM | POA: Diagnosis not present

## 2016-08-21 DIAGNOSIS — F17200 Nicotine dependence, unspecified, uncomplicated: Secondary | ICD-10-CM | POA: Insufficient documentation

## 2016-08-21 DIAGNOSIS — D352 Benign neoplasm of pituitary gland: Secondary | ICD-10-CM | POA: Diagnosis not present

## 2016-08-21 DIAGNOSIS — Z0000 Encounter for general adult medical examination without abnormal findings: Secondary | ICD-10-CM | POA: Diagnosis not present

## 2016-08-21 NOTE — Patient Instructions (Signed)
Tobacco Cessation:   1800QUITNOW or [phone], the former for support and possibly free nicotine patches/gum and support; the latter for [facility] Smoking cessation class. Get rid of all smoking supplies:  Cigarettes, lighters, ashtrays--no stashes just in case at home if you are serious.Tobacco Cessation:    For nicotine patches:  Stop smoking anything the day you start the first patch Start with 21 mg patch and reapply new to different area of skin every 24 hours for 30 days. Then 14 mg patch changed every 24 hours for 14 days. Then 7 mg patch changed every 24 hours for 14 days.

## 2016-08-21 NOTE — Progress Notes (Signed)
Subjective:    Patient ID: Neil [name], male    DOB: 08/14/1960, 56 y.o.   MRN: [id number]  HPI   Here for Male CPE:  1.  STE:  Sometimes. No family history  2.  PSA/DRE:  Checked 2 years ago and normal at 0.6.  DRE was reportedly normal then as well.  No family history.  3.  Guaiac Cards:  never   4.  Colonoscopy: 12/21/2011 with hyperplastic polyps x 2 only.  No family history  5.  Cholesterol/Glucose:  Has had dyslipidemia in past--2016.  6.  Immunizations:   Immunization History  Administered Date(s) Administered  . Influenza-Unspecified 05/19/2016  . Tdap 06/08/2015   Current Meds  Medication Sig  . paliperidone (INVEGA) 9 MG 24 hr tablet Take 9 mg by mouth every morning.  . vortioxetine HBr (TRINTELLIX) 10 MG TABS Take by mouth as needed.   No Known Allergies   Past Medical History:  Diagnosis Date  . Bipolar disorder [facility]) age 59 yo  . Chest pain   . Dyslipidemia, goal LDL below 100   . Schizophrenia [facility]) age 56 yo  . 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  . Tobacco abuse     Past Surgical History:  Procedure Laterality Date  . HEMORROIDECTOMY  2007   Family History  Problem Relation Age of Onset  . Hypertension Mother   . Stroke Mother   . Mental retardation Mother   . Heart failure Father   . Hypertension Sister   . Bipolar disorder Son   . Schizophrenia Son    Social History   Social History  . Marital status: Single    Spouse name: N/A  . Number of children: 2  . Years of education: 12   Occupational History  . unemployed carpentry/upholstery    Social History Main Topics  . Smoking status: Current Every Day Smoker    Packs/day: 0.50    Years: 40.00    Types: Cigarettes  . Smokeless tobacco: Never Used  . Alcohol use No  . Drug use: Yes    Types: Marijuana     Comment: rare usage  . Sexual activity: Not Currently    Birth control/  protection: None   Other Topics Concern  . Not on file   Social History Narrative   Born in [place], [place]   Has lived in [place] for years.   Currently lives with maternal uncle, who also smokes, but is trying to get his own place.     Working on disability.        Review of Systems  Constitutional: Negative for fever and unexpected weight change.       Mild decrease in energy.  Feels he stays stressed with the pituitary tumor.  HENT: Positive for dental problem. Negative for rhinorrhea and sinus pressure.        Has list of Medicaid dentists from last visits.  May have hearing loss at times in one ear, but cannot recall which ear is the problem  Eyes: Negative for pain, itching and visual disturbance.  Respiratory: Negative for cough and shortness of breath.   Cardiovascular: Negative for chest pain, palpitations and leg swelling.  Gastrointestinal: Negative for abdominal pain, blood in stool, constipation, diarrhea, nausea and vomiting.       No melena  Endocrine: Negative for cold intolerance and heat intolerance.  Genitourinary: Negative for discharge, dysuria, frequency,  genital sores, hematuria, scrotal swelling, testicular pain and urgency.  Musculoskeletal: Negative for arthralgias.  Skin: Negative for rash.       No skin lesions with changes  Neurological: Positive for headaches. Negative for weakness and numbness.       Has pituitary macroadenoma being worked up by Dr. Arnoldo Morale and endocrine, ophthalmologist, Dr. Katy Fitch.  Hematological: Does not bruise/bleed easily.  Psychiatric/Behavioral: Negative for dysphoric mood and suicidal ideas. The patient is nervous/anxious.        Anxious about pituitary tumor       Objective:   Physical Exam  Constitutional: He is oriented to person, place, and time. He appears well-developed and well-nourished.  HENT:  Head: Normocephalic and atraumatic.  Right Ear: Hearing, tympanic membrane, external ear and ear canal normal.    Left Ear: Hearing, tympanic membrane, external ear and ear canal normal.  Nose: Nose normal.  Mouth/Throat: Uvula is midline, oropharynx is clear and moist and mucous membranes are normal. Dental caries present.  Eyes: Conjunctivae and EOM are normal. Pupils are equal, round, and reactive to light.  Discs sharp bilaterally  Neck: Normal range of motion. Neck supple. No thyromegaly present.  Cardiovascular: Normal rate, regular rhythm, S1 normal and S2 normal.  Exam reveals no S3, no S4 and no friction rub.   No murmur heard. No carotid bruits.  Carotid, radial, femoral, DP and PT pulses normal and equal.   Pulmonary/Chest: Effort normal and breath sounds normal.  Abdominal: Soft. Bowel sounds are normal. He exhibits no mass. There is no hepatosplenomegaly. There is no tenderness. No hernia. Hernia confirmed negative in the right inguinal area and confirmed negative in the left inguinal area.  Genitourinary: Rectum normal, prostate normal and penis normal. Rectal exam shows guaiac negative stool. Right testis shows no mass, no swelling and no tenderness. Right testis is descended. Left testis shows no mass, no swelling and no tenderness. Left testis is descended. Uncircumcised.  Musculoskeletal: Normal range of motion.  Lymphadenopathy:       Head (right side): No submental and no submandibular adenopathy present.       Head (left side): No submental and no submandibular adenopathy present.    He has no cervical adenopathy.    He has no axillary adenopathy.       Right: No inguinal and no supraclavicular adenopathy present.       Left: No inguinal and no supraclavicular adenopathy present.  Neurological: He is alert and oriented to person, place, and time. He has normal strength and normal reflexes. No cranial nerve deficit or sensory deficit. Coordination and gait normal.  Skin: Skin is warm and dry. No lesion and no rash noted.  Tattoos  Psychiatric: He has a normal mood and affect. His  speech is normal and behavior is normal. Judgment and thought content normal. Cognition and memory are normal.          Assessment & Plan:  1.  CPE Immunizations up to date. PSA, CBC, CMP, FLP today.  2.  Tobacco abuse:  Encouraged getting started with nicotine patches.  Information for support services given.   He may wait until he is no longer living with uncle as he does not think his uncle will be interested in quitting with him. Follow up for this in 6 months  3.  Dyslipidemia:  Labs as above  4.  Pituitary Macroadenoma:  Pt. To be seen by endocrinology.  Will check Dr. Adline Mango notes on this.  Encouraged patient to call Dr.  Jenkins's office if he does not hear about that soon.

## 2016-08-22 LAB — COMPREHENSIVE METABOLIC PANEL
A/G RATIO: 1.8 (ref 1.2–2.2)
ALBUMIN: 4.8 g/dL (ref 3.5–5.5)
ALT: 21 IU/L (ref 0–44)
AST: 23 IU/L (ref 0–40)
Alkaline Phosphatase: 55 IU/L (ref 39–117)
BUN / CREAT RATIO: 13 (ref 9–20)
BUN: 14 mg/dL (ref 6–24)
Bilirubin Total: 0.5 mg/dL (ref 0.0–1.2)
CALCIUM: 9.8 mg/dL (ref 8.7–10.2)
CO2: 24 mmol/L (ref 18–29)
Chloride: 101 mmol/L (ref 96–106)
Creatinine, Ser: 1.12 mg/dL (ref 0.76–1.27)
GFR, EST AFRICAN AMERICAN: 84 mL/min/{1.73_m2} (ref >59)
GFR, EST NON AFRICAN AMERICAN: 73 mL/min/{1.73_m2} (ref >59)
Globulin, Total: 2.7 g/dL (ref 1.5–4.5)
Glucose: 85 mg/dL (ref 65–99)
POTASSIUM: 4.4 mmol/L (ref 3.5–5.2)
Sodium: 140 mmol/L (ref 134–144)
TOTAL PROTEIN: 7.5 g/dL (ref 6.0–8.5)

## 2016-08-22 LAB — LIPID PANEL W/O CHOL/HDL RATIO
Cholesterol, Total: 239 mg/dL — ABNORMAL HIGH (ref 100–199)
HDL: 40 mg/dL (ref >39)
LDL Calculated: 177 mg/dL — ABNORMAL HIGH (ref 0–99)
Triglycerides: 109 mg/dL (ref 0–149)
VLDL CHOLESTEROL CAL: 22 mg/dL (ref 5–40)

## 2016-08-22 LAB — CBC WITH DIFFERENTIAL/PLATELET
BASOS: 0 %
Basophils Absolute: 0 10*3/uL (ref 0.0–0.2)
EOS (ABSOLUTE): 0.2 10*3/uL (ref 0.0–0.4)
EOS: 2 %
HEMOGLOBIN: 12.7 g/dL — AB (ref 13.0–17.7)
Hematocrit: 40.2 % (ref 37.5–51.0)
IMMATURE GRANS (ABS): 0 10*3/uL (ref 0.0–0.1)
IMMATURE GRANULOCYTES: 0 %
LYMPHS: 47 %
Lymphocytes Absolute: 3.3 10*3/uL — ABNORMAL HIGH (ref 0.7–3.1)
MCH: 29.4 pg (ref 26.6–33.0)
MCHC: 31.6 g/dL (ref 31.5–35.7)
MCV: 93 fL (ref 79–97)
MONOCYTES: 7 %
Monocytes Absolute: 0.5 10*3/uL (ref 0.1–0.9)
NEUTROS ABS: 3.1 10*3/uL (ref 1.4–7.0)
NEUTROS PCT: 44 %
PLATELETS: 351 10*3/uL (ref 150–379)
RBC: 4.32 x10E6/uL (ref 4.14–5.80)
RDW: 14.6 % (ref 12.3–15.4)
WBC: 7 10*3/uL (ref 3.4–10.8)

## 2016-08-22 LAB — PSA: PROSTATE SPECIFIC AG, SERUM: 0.5 ng/mL (ref 0.0–4.0)

## 2016-08-31 ENCOUNTER — Other Ambulatory Visit: Payer: Medicaid Other

## 2016-08-31 DIAGNOSIS — D509 Iron deficiency anemia, unspecified: Secondary | ICD-10-CM

## 2016-09-01 LAB — IRON AND TIBC
IRON SATURATION: 38 % (ref 15–55)
IRON: 88 ug/dL (ref 38–169)
Total Iron Binding Capacity: 232 ug/dL — ABNORMAL LOW (ref 250–450)
UIBC: 144 ug/dL (ref 111–343)

## 2016-09-04 ENCOUNTER — Other Ambulatory Visit (INDEPENDENT_AMBULATORY_CARE_PROVIDER_SITE_OTHER): Payer: Medicaid Other

## 2016-09-04 DIAGNOSIS — Z1211 Encounter for screening for malignant neoplasm of colon: Secondary | ICD-10-CM

## 2016-09-04 LAB — POC HEMOCCULT BLD/STL (HOME/3-CARD/SCREEN)
FECAL OCCULT BLD: NEGATIVE
FECAL OCCULT BLD: NEGATIVE
Fecal Occult Blood, POC: NEGATIVE

## 2016-09-18 ENCOUNTER — Encounter: Payer: Self-pay | Admitting: Endocrinology

## 2016-09-18 ENCOUNTER — Ambulatory Visit (INDEPENDENT_AMBULATORY_CARE_PROVIDER_SITE_OTHER): Payer: Medicaid Other | Admitting: Endocrinology

## 2016-09-18 VITALS — BP 124/78 | HR 65 | Ht 68.0 in | Wt 161.0 lb

## 2016-09-18 DIAGNOSIS — D352 Benign neoplasm of pituitary gland: Secondary | ICD-10-CM | POA: Diagnosis not present
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
## 2016-09-21 ENCOUNTER — Other Ambulatory Visit (INDEPENDENT_AMBULATORY_CARE_PROVIDER_SITE_OTHER): Payer: Medicaid Other

## 2016-09-21 DIAGNOSIS — D352 Benign neoplasm of pituitary gland: Secondary | ICD-10-CM

## 2016-09-21 LAB — BASIC METABOLIC PANEL WITH GFR
BUN: 11 mg/dL (ref 6–23)
CO2: 29 meq/L (ref 19–32)
Calcium: 9.8 mg/dL (ref 8.4–10.5)
Chloride: 108 meq/L (ref 96–112)
Creatinine, Ser: 1.24 mg/dL (ref 0.40–1.50)
GFR: 77.47 mL/min
Glucose, Bld: 96 mg/dL (ref 70–99)
Potassium: 4.3 meq/L (ref 3.5–5.1)
Sodium: 139 meq/L (ref 135–145)

## 2016-09-21 LAB — T4, FREE: Free T4: 0.61 ng/dL (ref 0.60–1.60)

## 2016-09-21 LAB — TESTOSTERONE: Testosterone: 233.8 ng/dL — ABNORMAL LOW (ref 300.00–890.00)

## 2016-09-22 ENCOUNTER — Other Ambulatory Visit: Payer: Self-pay

## 2016-09-22 LAB — INSULIN-LIKE GROWTH FACTOR: Insulin-Like GF-1: 98 ng/mL (ref 54–194)

## 2016-09-22 MED ORDER — LEVOTHYROXINE SODIUM 25 MCG PO TABS: 25.0000 ug | ORAL_TABLET | Freq: Every day | ORAL | 3 refills | Status: DC

## 2016-09-22 NOTE — Progress Notes (Signed)
Please let patient know that the thyroid level is slightly low, start levothyroxine 25 g daily.  Also since he has visual changes on his eye exam he needs to be scheduled for surgery by Dr. Arnoldo Morale, needs to call to follow-up Needs to make appointment for follow-up in about a month

## 2016-10-04 ENCOUNTER — Other Ambulatory Visit: Payer: Self-pay | Admitting: Neurosurgery

## 2016-10-23 ENCOUNTER — Telehealth: Payer: Self-pay | Admitting: Internal Medicine

## 2016-10-23 NOTE — Telephone Encounter (Signed)
Patient would like referral to neurologist as well as needs information regarding smoking patches.

## 2016-11-20 ENCOUNTER — Inpatient Hospital Stay (HOSPITAL_COMMUNITY)
Admission: RE | Admit: 2016-11-20 | Discharge: 2016-11-20 | Disposition: A | Discharge status: Home / Self Care | Payer: Medicaid Other | Source: Ambulatory Visit

## 2016-11-20 NOTE — Pre-Procedure Instructions (Signed)
Franciscojavier Wronski  11/20/2016      [id number], [place] - [address] [place] [address] Phone: [phone] Fax: [phone]  [facility], [place] - [address] AT [address] [place] [address] Phone: [phone] Fax: [phone]  [place] [address] Phone: [phone] Fax: [phone]    Your procedure is scheduled on June 28.  Report to [facility] Admitting at 730 A.M.  Call this number if you have problems the morning of surgery:  [phone]   Remember:  Do not eat food or drink liquids after midnight.  Take these medicines the morning of surgery with A SIP OF WATER tylenol if needed, levothyroxine (Synthroid)  7 days prior to surgery stop taking aspirin, BC's, goody's, Herbal medications, Fish Oil, Ibuprofen, Advil, Motrin, Aleve, Vitamins   Do not wear jewelry, make-up or nail polish.  Do not wear lotions, powders, or perfumes, or deoderant.  Do not shave 48 hours prior to surgery.  Men may shave face and neck.  Do not bring valuables to the hospital.  [facility] is not responsible for any belongings or valuables.  Contacts, dentures or bridgework may not be worn into surgery.  Leave your suitcase in the car.  After surgery it may be brought to your room.  For patients admitted to the hospital, discharge time will be determined by your treatment team.  Patients discharged the day of surgery will not be allowed to drive home.   Special instructions:  [facility] - Preparing for Surgery  Before surgery, you can play an important role.  Because skin is not sterile, your skin needs to be as free of germs as possible.  You can reduce the number of germs on you skin by washing with CHG (chlorahexidine gluconate) soap before surgery.  CHG is an  antiseptic cleaner which kills germs and bonds with the skin to continue killing germs even after washing.  Please DO NOT use if you have an allergy to CHG or antibacterial soaps.  If your skin becomes reddened/irritated stop using the CHG and inform your nurse when you arrive at Short Stay.  Do not shave (including legs and underarms) for at least 48 hours prior to the first CHG shower.  You may shave your face.  Please follow these instructions carefully:   1.  Shower with CHG Soap the night before surgery and the   morning of Surgery.  2.  If you choose to wash your hair, wash your hair first as usual with your   normal shampoo.  3.  After you shampoo, rinse your hair and body thoroughly to remove the  Shampoo.  4.  Use CHG as you would any other liquid soap.  You can apply chg directly   to the skin and wash gently with scrungie or a clean washcloth.  5.  Apply the CHG Soap to your body ONLY FROM THE NECK DOWN.    Do not use on open wounds or open sores.  Avoid contact with your eyes,       ears, mouth and genitals (private parts).  Wash genitals (private parts)  with your normal soap.  6.  Wash thoroughly, paying special attention to the area where your surgery  will be performed.  7.  Thoroughly rinse your body with warm  water from the neck down.  8.  DO NOT shower/wash with your normal soap after using and rinsing off   the CHG Soap.  9.  Pat yourself dry with a clean towel.            10.  Wear clean pajamas.            11.  Place clean sheets on your bed the night of your first shower and do not  sleep with pets.  Day of Surgery  Do not apply any lotions/deoderants the morning of surgery.  Please wear clean clothes to the hospital/surgery center.    Please read over the following fact sheets that you were given. Pain Booklet, Coughing and Deep Breathing and Surgical Site Infection Prevention

## 2016-11-20 NOTE — Progress Notes (Addendum)
Not here for his PAT appointment called pt states he forgot. Baker Janus informed.

## 2016-11-27 NOTE — Telephone Encounter (Signed)
Patient already seeing a neurologist. Has questions about surgery.

## 2016-11-28 ENCOUNTER — Other Ambulatory Visit: Payer: Self-pay

## 2016-11-28 ENCOUNTER — Encounter (HOSPITAL_COMMUNITY): Payer: Self-pay

## 2016-11-28 ENCOUNTER — Encounter (HOSPITAL_COMMUNITY)
Admission: RE | Admit: 2016-11-28 | Discharge: 2016-11-28 | Disposition: A | Discharge status: Home / Self Care | Payer: Medicaid Other | Source: Ambulatory Visit | Attending: Neurosurgery | Admitting: Neurosurgery

## 2016-11-28 DIAGNOSIS — Z01812 Encounter for preprocedural laboratory examination: Secondary | ICD-10-CM | POA: Insufficient documentation

## 2016-11-28 DIAGNOSIS — Z01810 Encounter for preprocedural cardiovascular examination: Secondary | ICD-10-CM | POA: Diagnosis not present

## 2016-11-28 HISTORY — DX: Headache, unspecified: R51.9

## 2016-11-28 HISTORY — DX: Hypothyroidism, unspecified: E03.9

## 2016-11-28 HISTORY — DX: Headache: R51

## 2016-11-28 HISTORY — DX: Anxiety disorder, unspecified: F41.9

## 2016-11-28 LAB — COMPREHENSIVE METABOLIC PANEL
ALBUMIN: 4.4 g/dL (ref 3.5–5.0)
ALK PHOS: 34 U/L — AB (ref 38–126)
ALT: 18 U/L (ref 17–63)
ANION GAP: 5 (ref 5–15)
AST: 26 U/L (ref 15–41)
BILIRUBIN TOTAL: 0.7 mg/dL (ref 0.3–1.2)
BUN: 13 mg/dL (ref 6–20)
CALCIUM: 9.5 mg/dL (ref 8.9–10.3)
CO2: 23 mmol/L (ref 22–32)
Chloride: 106 mmol/L (ref 101–111)
Creatinine, Ser: 1.11 mg/dL (ref 0.61–1.24)
GFR calc Af Amer: >60 mL/min (ref >60)
GFR calc non Af Amer: >60 mL/min (ref >60)
GLUCOSE: 87 mg/dL (ref 65–99)
Potassium: 4.1 mmol/L (ref 3.5–5.1)
Sodium: 134 mmol/L — ABNORMAL LOW (ref 135–145)
TOTAL PROTEIN: 6.9 g/dL (ref 6.5–8.1)

## 2016-11-28 LAB — CBC
HCT: 37.2 % — ABNORMAL LOW (ref 39.0–52.0)
HEMOGLOBIN: 12 g/dL — AB (ref 13.0–17.0)
MCH: 30.2 pg (ref 26.0–34.0)
MCHC: 32.3 g/dL (ref 30.0–36.0)
MCV: 93.5 fL (ref 78.0–100.0)
Platelets: 325 10*3/uL (ref 150–400)
RBC: 3.98 MIL/uL — ABNORMAL LOW (ref 4.22–5.81)
RDW: 14.1 % (ref 11.5–15.5)
WBC: 6.6 10*3/uL (ref 4.0–10.5)

## 2016-12-07 ENCOUNTER — Inpatient Hospital Stay (HOSPITAL_COMMUNITY): Payer: Medicaid Other | Admitting: Certified Registered Nurse Anesthetist

## 2016-12-07 ENCOUNTER — Encounter (HOSPITAL_COMMUNITY): Payer: Self-pay | Admitting: Urology

## 2016-12-07 ENCOUNTER — Inpatient Hospital Stay (HOSPITAL_COMMUNITY)
Admission: RE | Admit: 2016-12-07 | Discharge: 2016-12-09 | DRG: 615 | Disposition: A | Discharge status: Home / Self Care | Payer: Medicaid Other | Source: Ambulatory Visit | Attending: Neurosurgery | Admitting: Neurosurgery

## 2016-12-07 ENCOUNTER — Inpatient Hospital Stay (HOSPITAL_COMMUNITY): Payer: Medicaid Other

## 2016-12-07 ENCOUNTER — Encounter (HOSPITAL_COMMUNITY)
Admission: RE | Disposition: A | Discharge status: Home / Self Care | Payer: Self-pay | Source: Ambulatory Visit | Attending: Neurosurgery

## 2016-12-07 DIAGNOSIS — D352 Benign neoplasm of pituitary gland: Principal | ICD-10-CM | POA: Diagnosis present

## 2016-12-07 DIAGNOSIS — F319 Bipolar disorder, unspecified: Secondary | ICD-10-CM | POA: Diagnosis present

## 2016-12-07 DIAGNOSIS — Z23 Encounter for immunization: Secondary | ICD-10-CM

## 2016-12-07 DIAGNOSIS — F17210 Nicotine dependence, cigarettes, uncomplicated: Secondary | ICD-10-CM | POA: Diagnosis present

## 2016-12-07 DIAGNOSIS — E785 Hyperlipidemia, unspecified: Secondary | ICD-10-CM | POA: Diagnosis present

## 2016-12-07 DIAGNOSIS — F209 Schizophrenia, unspecified: Secondary | ICD-10-CM | POA: Diagnosis present

## 2016-12-07 DIAGNOSIS — Z87898 Personal history of other specified conditions: Secondary | ICD-10-CM

## 2016-12-07 DIAGNOSIS — E039 Hypothyroidism, unspecified: Secondary | ICD-10-CM | POA: Diagnosis present

## 2016-12-07 DIAGNOSIS — Z818 Family history of other mental and behavioral disorders: Secondary | ICD-10-CM

## 2016-12-07 DIAGNOSIS — Z810 Family history of intellectual disabilities: Secondary | ICD-10-CM

## 2016-12-07 HISTORY — PX: TRANSNASAL APPROACH: SHX6149

## 2016-12-07 HISTORY — PX: CRANIOTOMY: SHX93

## 2016-12-07 SURGERY — CRANIOTOMY HYPOPHYSECTOMY TRANSNASAL APPROACH
Anesthesia: General | Site: Nose

## 2016-12-07 MED ORDER — DEXAMETHASONE SODIUM PHOSPHATE 4 MG/ML IJ SOLN: 4.0000 mg | Freq: Four times a day (QID) | INTRAMUSCULAR | Status: DC

## 2016-12-07 MED ORDER — CEFAZOLIN SODIUM-DEXTROSE 2-4 GM/100ML-% IV SOLN
2.0000 g | INTRAVENOUS | Status: AC
  Administered 2016-12-07: 2 g via INTRAVENOUS

## 2016-12-07 MED ORDER — FENTANYL CITRATE (PF) 100 MCG/2ML IJ SOLN: 25.0000 ug | INTRAMUSCULAR | Status: DC | PRN

## 2016-12-07 MED ORDER — HEMOSTATIC AGENTS (NO CHARGE) OPTIME
TOPICAL | Status: DC | PRN
  Administered 2016-12-07: 1 via TOPICAL

## 2016-12-07 MED ORDER — MORPHINE SULFATE (PF) 4 MG/ML IV SOLN
1.0000 mg | INTRAVENOUS | Status: DC | PRN
  Administered 2016-12-08: 1 mg via INTRAVENOUS
  Filled 2016-12-07: qty 1

## 2016-12-07 MED ORDER — FENTANYL CITRATE (PF) 250 MCG/5ML IJ SOLN
INTRAMUSCULAR | Status: AC
  Filled 2016-12-07: qty 5

## 2016-12-07 MED ORDER — THROMBIN 5000 UNITS EX SOLR
CUTANEOUS | Status: AC
Start: 2016-12-07 — End: 2016-12-07
  Filled 2016-12-07: qty 5000

## 2016-12-07 MED ORDER — LACTATED RINGERS IV SOLN: INTRAVENOUS | Status: DC

## 2016-12-07 MED ORDER — PNEUMOCOCCAL VAC POLYVALENT 25 MCG/0.5ML IJ INJ
0.5000 mL | INJECTION | INTRAMUSCULAR | Status: AC
  Administered 2016-12-09: 0.5 mL via INTRAMUSCULAR
  Filled 2016-12-07 (×2): qty 0.5

## 2016-12-07 MED ORDER — LEVOTHYROXINE SODIUM 25 MCG PO TABS
25.0000 ug | ORAL_TABLET | Freq: Every day | ORAL | Status: DC
  Administered 2016-12-08 – 2016-12-09 (×2): 25 ug via ORAL
  Filled 2016-12-07 (×2): qty 1

## 2016-12-07 MED ORDER — BACITRACIN-NEOMYCIN-POLYMYXIN 400-5-5000 EX OINT
TOPICAL_OINTMENT | CUTANEOUS | Status: AC
  Filled 2016-12-07: qty 1

## 2016-12-07 MED ORDER — CHLORHEXIDINE GLUCONATE CLOTH 2 % EX PADS: 6.0000 | MEDICATED_PAD | Freq: Once | CUTANEOUS | Status: DC

## 2016-12-07 MED ORDER — OXYMETAZOLINE HCL 0.05 % NA SOLN
NASAL | Status: AC
  Filled 2016-12-07: qty 15

## 2016-12-07 MED ORDER — FENTANYL CITRATE (PF) 100 MCG/2ML IJ SOLN
INTRAMUSCULAR | Status: DC | PRN
  Administered 2016-12-07: 100 ug via INTRAVENOUS
  Administered 2016-12-07 (×4): 50 ug via INTRAVENOUS
  Administered 2016-12-07 (×2): 25 ug via INTRAVENOUS
  Administered 2016-12-07: 50 ug via INTRAVENOUS

## 2016-12-07 MED ORDER — LACTATED RINGERS IV SOLN
INTRAVENOUS | Status: DC | PRN
  Administered 2016-12-07 (×2): via INTRAVENOUS

## 2016-12-07 MED ORDER — DOCUSATE SODIUM 100 MG PO CAPS
100.0000 mg | ORAL_CAPSULE | Freq: Two times a day (BID) | ORAL | Status: DC
  Administered 2016-12-07 – 2016-12-09 (×4): 100 mg via ORAL
  Filled 2016-12-07 (×4): qty 1

## 2016-12-07 MED ORDER — ROCURONIUM BROMIDE 10 MG/ML (PF) SYRINGE
PREFILLED_SYRINGE | INTRAVENOUS | Status: AC
  Filled 2016-12-07: qty 20

## 2016-12-07 MED ORDER — LABETALOL HCL 5 MG/ML IV SOLN
INTRAVENOUS | Status: AC
  Filled 2016-12-07: qty 4

## 2016-12-07 MED ORDER — ONDANSETRON HCL 4 MG/2ML IJ SOLN: 4.0000 mg | INTRAMUSCULAR | Status: DC | PRN

## 2016-12-07 MED ORDER — ONDANSETRON HCL 4 MG PO TABS: 4.0000 mg | ORAL_TABLET | ORAL | Status: DC | PRN

## 2016-12-07 MED ORDER — LACTATED RINGERS IV SOLN
INTRAVENOUS | Status: DC
  Administered 2016-12-07 (×2): via INTRAVENOUS

## 2016-12-07 MED ORDER — HYDROCODONE-ACETAMINOPHEN 5-325 MG PO TABS
1.0000 | ORAL_TABLET | ORAL | Status: DC | PRN
  Administered 2016-12-07 – 2016-12-09 (×7): 1 via ORAL
  Filled 2016-12-07 (×7): qty 1

## 2016-12-07 MED ORDER — PROMETHAZINE HCL 25 MG PO TABS: 12.5000 mg | ORAL_TABLET | ORAL | Status: DC | PRN

## 2016-12-07 MED ORDER — SUGAMMADEX SODIUM 200 MG/2ML IV SOLN
INTRAVENOUS | Status: DC | PRN
  Administered 2016-12-07: 300 mg via INTRAVENOUS

## 2016-12-07 MED ORDER — ROCURONIUM BROMIDE 100 MG/10ML IV SOLN
INTRAVENOUS | Status: DC | PRN
  Administered 2016-12-07 (×2): 50 mg via INTRAVENOUS
  Administered 2016-12-07: 20 mg via INTRAVENOUS

## 2016-12-07 MED ORDER — MIDAZOLAM HCL 5 MG/5ML IJ SOLN
INTRAMUSCULAR | Status: DC | PRN
  Administered 2016-12-07: 2 mg via INTRAVENOUS

## 2016-12-07 MED ORDER — POTASSIUM CHLORIDE IN NACL 20-0.9 MEQ/L-% IV SOLN
INTRAVENOUS | Status: DC
  Administered 2016-12-07: 100 mL/h via INTRAVENOUS
  Administered 2016-12-08: 02:00:00 via INTRAVENOUS
  Filled 2016-12-07 (×3): qty 1000

## 2016-12-07 MED ORDER — HYDRALAZINE HCL 20 MG/ML IJ SOLN
10.0000 mg | INTRAMUSCULAR | Status: DC | PRN
  Administered 2016-12-07: 10 mg via INTRAVENOUS
  Filled 2016-12-07: qty 1

## 2016-12-07 MED ORDER — THROMBIN 5000 UNITS EX SOLR
CUTANEOUS | Status: DC | PRN
  Administered 2016-12-07: 5 mL via TOPICAL

## 2016-12-07 MED ORDER — BACITRACIN ZINC 500 UNIT/GM EX OINT
TOPICAL_OINTMENT | CUTANEOUS | Status: AC
  Filled 2016-12-07: qty 28.35

## 2016-12-07 MED ORDER — LIDOCAINE-EPINEPHRINE 1 %-1:100000 IJ SOLN
INTRAMUSCULAR | Status: DC | PRN
  Administered 2016-12-07: 10 mL

## 2016-12-07 MED ORDER — BACITRACIN ZINC 500 UNIT/GM EX OINT
TOPICAL_OINTMENT | CUTANEOUS | Status: DC | PRN
  Administered 2016-12-07: 1 via TOPICAL

## 2016-12-07 MED ORDER — LIDOCAINE-EPINEPHRINE 1 %-1:100000 IJ SOLN
INTRAMUSCULAR | Status: AC
  Filled 2016-12-07: qty 1

## 2016-12-07 MED ORDER — BACITRACIN-NEOMYCIN-POLYMYXIN 400-5-5000 EX OINT
TOPICAL_OINTMENT | CUTANEOUS | Status: DC | PRN
  Administered 2016-12-07: 1 via TOPICAL

## 2016-12-07 MED ORDER — PHENYLEPHRINE HCL 10 MG/ML IJ SOLN
INTRAMUSCULAR | Status: DC | PRN
  Administered 2016-12-07: 40 ug via INTRAVENOUS
  Administered 2016-12-07 (×2): 80 ug via INTRAVENOUS

## 2016-12-07 MED ORDER — PROPOFOL 10 MG/ML IV BOLUS
INTRAVENOUS | Status: AC
  Filled 2016-12-07: qty 20

## 2016-12-07 MED ORDER — ROCURONIUM BROMIDE 10 MG/ML (PF) SYRINGE
PREFILLED_SYRINGE | INTRAVENOUS | Status: AC
  Filled 2016-12-07: qty 5

## 2016-12-07 MED ORDER — ACETAMINOPHEN 325 MG PO TABS
650.0000 mg | ORAL_TABLET | ORAL | Status: DC | PRN
  Administered 2016-12-07: 650 mg via ORAL
  Filled 2016-12-07: qty 2

## 2016-12-07 MED ORDER — OXYMETAZOLINE HCL 0.05 % NA SOLN
NASAL | Status: DC | PRN
  Administered 2016-12-07: 1

## 2016-12-07 MED ORDER — ONDANSETRON HCL 4 MG/2ML IJ SOLN
INTRAMUSCULAR | Status: DC | PRN
  Administered 2016-12-07: 4 mg via INTRAVENOUS

## 2016-12-07 MED ORDER — THROMBIN 5000 UNITS EX SOLR
CUTANEOUS | Status: DC | PRN
  Administered 2016-12-07 (×2): 5000 [IU] via TOPICAL

## 2016-12-07 MED ORDER — DEXAMETHASONE SODIUM PHOSPHATE 10 MG/ML IJ SOLN
6.0000 mg | Freq: Four times a day (QID) | INTRAMUSCULAR | Status: AC
  Administered 2016-12-07 – 2016-12-08 (×4): 6 mg via INTRAVENOUS
  Filled 2016-12-07 (×4): qty 1

## 2016-12-07 MED ORDER — CEFAZOLIN SODIUM-DEXTROSE 2-4 GM/100ML-% IV SOLN
INTRAVENOUS | Status: AC
  Filled 2016-12-07: qty 100

## 2016-12-07 MED ORDER — LABETALOL HCL 5 MG/ML IV SOLN
10.0000 mg | INTRAVENOUS | Status: DC | PRN
  Administered 2016-12-07 (×2): 20 mg via INTRAVENOUS
  Filled 2016-12-07: qty 4

## 2016-12-07 MED ORDER — MEPERIDINE HCL 25 MG/ML IJ SOLN
6.2500 mg | INTRAMUSCULAR | Status: DC | PRN
Start: 2016-12-07 — End: 2016-12-07

## 2016-12-07 MED ORDER — LIDOCAINE HCL (CARDIAC) 20 MG/ML IV SOLN
INTRAVENOUS | Status: DC | PRN
  Administered 2016-12-07: 60 mg via INTRAVENOUS

## 2016-12-07 MED ORDER — ACETAMINOPHEN 650 MG RE SUPP: 650.0000 mg | RECTAL | Status: DC | PRN

## 2016-12-07 MED ORDER — DEXAMETHASONE SODIUM PHOSPHATE 4 MG/ML IJ SOLN
INTRAMUSCULAR | Status: DC | PRN
  Administered 2016-12-07: 10 mg via INTRAVENOUS

## 2016-12-07 MED ORDER — DEXAMETHASONE SODIUM PHOSPHATE 4 MG/ML IJ SOLN: 4.0000 mg | Freq: Three times a day (TID) | INTRAMUSCULAR | Status: DC

## 2016-12-07 MED ORDER — ROCURONIUM BROMIDE 10 MG/ML (PF) SYRINGE
PREFILLED_SYRINGE | INTRAVENOUS | Status: AC
  Filled 2016-12-07: qty 10

## 2016-12-07 MED ORDER — SUGAMMADEX SODIUM 500 MG/5ML IV SOLN
INTRAVENOUS | Status: AC
  Filled 2016-12-07: qty 5

## 2016-12-07 MED ORDER — THROMBIN 5000 UNITS EX SOLR
CUTANEOUS | Status: AC
  Filled 2016-12-07: qty 10000

## 2016-12-07 MED ORDER — PROPOFOL 10 MG/ML IV BOLUS
INTRAVENOUS | Status: DC | PRN
  Administered 2016-12-07: 120 mg via INTRAVENOUS
  Administered 2016-12-07 (×2): 50 mg via INTRAVENOUS

## 2016-12-07 MED ORDER — CEFAZOLIN SODIUM-DEXTROSE 2-4 GM/100ML-% IV SOLN
2.0000 g | Freq: Three times a day (TID) | INTRAVENOUS | Status: DC
  Administered 2016-12-07 – 2016-12-09 (×5): 2 g via INTRAVENOUS
  Filled 2016-12-07 (×6): qty 100

## 2016-12-07 MED ORDER — PROMETHAZINE HCL 25 MG/ML IJ SOLN: 6.2500 mg | INTRAMUSCULAR | Status: DC | PRN

## 2016-12-07 MED ORDER — PHENYLEPHRINE HCL 10 MG/ML IJ SOLN
INTRAVENOUS | Status: DC | PRN
  Administered 2016-12-07: 50 ug/min via INTRAVENOUS

## 2016-12-07 MED ORDER — MIDAZOLAM HCL 2 MG/2ML IJ SOLN
INTRAMUSCULAR | Status: AC
  Filled 2016-12-07: qty 2

## 2016-12-07 MED ORDER — PANTOPRAZOLE SODIUM 40 MG IV SOLR
40.0000 mg | Freq: Every day | INTRAVENOUS | Status: DC
  Administered 2016-12-07: 40 mg via INTRAVENOUS
  Filled 2016-12-07: qty 40

## 2016-12-07 MED ORDER — 0.9 % SODIUM CHLORIDE (POUR BTL) OPTIME
TOPICAL | Status: DC | PRN
  Administered 2016-12-07: 1000 mL

## 2016-12-07 SURGICAL SUPPLY — 128 items
APL SKNCLS STERI-STRIP NONHPOA (GAUZE/BANDAGES/DRESSINGS) ×1
BALL CTTN LRG ABS STRL LF (GAUZE/BANDAGES/DRESSINGS)
BENZOIN TINCTURE PRP APPL 2/3 (GAUZE/BANDAGES/DRESSINGS) ×3 IMPLANT
BLADE 10 SAFETY STRL DISP (BLADE) ×1 IMPLANT
BLADE EYE SICKLE 84 5 BEAV (BLADE) IMPLANT
BLADE EYE SICKLE 84 5MM BEAV (BLADE)
BLADE SURG 10 STRL SS (BLADE) ×3 IMPLANT
BLADE SURG 11 STRL SS (BLADE) ×4 IMPLANT
BLADE SURG 15 STRL LF DISP TIS (BLADE) ×1 IMPLANT
BLADE SURG 15 STRL SS (BLADE) ×3
BUR MATCHSTICK NEURO 3.0 LAGG (BURR) IMPLANT
CANISTER SUCT 3000ML PPV (MISCELLANEOUS) ×6 IMPLANT
CARTRIDGE OIL MAESTRO DRILL (MISCELLANEOUS) ×1 IMPLANT
CATH ROBINSON RED A/P 14FR (CATHETERS) IMPLANT
CLEANER TIP ELECTROSURG 2X2 (MISCELLANEOUS) IMPLANT
CLOSURE STERI-STRIP 1/2X4 (GAUZE/BANDAGES/DRESSINGS) ×1
CLOSURE WOUND 1/2 X4 (GAUZE/BANDAGES/DRESSINGS)
CLOSURE WOUND 1/4X4 (GAUZE/BANDAGES/DRESSINGS)
CLSR STERI-STRIP ANTIMIC 1/2X4 (GAUZE/BANDAGES/DRESSINGS) ×1 IMPLANT
COAGULATOR SUCT 8FR VV (MISCELLANEOUS) IMPLANT
CONT SPEC 4OZ CLIKSEAL STRL BL (MISCELLANEOUS) ×2 IMPLANT
CORDS BIPOLAR (ELECTRODE) ×3 IMPLANT
COTTONBALL LRG STERILE PKG (GAUZE/BANDAGES/DRESSINGS) IMPLANT
COVER MAYO STAND STRL (DRAPES) ×2 IMPLANT
DECANTER SPIKE VIAL GLASS SM (MISCELLANEOUS) ×3 IMPLANT
DIFFUSER DRILL AIR PNEUMATIC (MISCELLANEOUS) ×1 IMPLANT
DRAIN SUBARACHNOID (WOUND CARE) IMPLANT
DRAPE EENT ADH APERT 15X15 STR (DRAPES) ×2 IMPLANT
DRAPE HALF SHEET 40X57 (DRAPES) ×9 IMPLANT
DRAPE INCISE IOBAN 66X45 STRL (DRAPES) ×3 IMPLANT
DRAPE MICROSCOPE LEICA (MISCELLANEOUS) ×3 IMPLANT
DRAPE POUCH INSTRU U-SHP 10X18 (DRAPES) ×3 IMPLANT
DRESSING NASAL KENNEDY 3.5X.9 (MISCELLANEOUS) ×1 IMPLANT
DRESSING NASAL POPE 10X1.5X2.5 (GAUZE/BANDAGES/DRESSINGS) IMPLANT
DRSG NASAL KENNEDY 3.5X.9 (MISCELLANEOUS) ×6
DRSG NASAL POPE 10X1.5X2.5 (GAUZE/BANDAGES/DRESSINGS) ×3
DRSG TELFA 3X8 NADH (GAUZE/BANDAGES/DRESSINGS) ×3 IMPLANT
ELECT CAUTERY BLADE 6.4 (BLADE) ×3 IMPLANT
ELECT COATED BLADE 2.86 ST (ELECTRODE) ×1 IMPLANT
ELECT NDL TIP 2.8 STRL (NEEDLE) ×1 IMPLANT
ELECT NEEDLE TIP 2.8 STRL (NEEDLE) ×3 IMPLANT
ELECT REM PT RETURN 9FT ADLT (ELECTROSURGICAL) ×3
ELECTRODE REM PT RTRN 9FT ADLT (ELECTROSURGICAL) ×1 IMPLANT
GAUZE PACKING FOLDED 2  STR (GAUZE/BANDAGES/DRESSINGS) ×2
GAUZE PACKING FOLDED 2 STR (GAUZE/BANDAGES/DRESSINGS) ×1 IMPLANT
GAUZE SPONGE 2X2 8PLY STRL LF (GAUZE/BANDAGES/DRESSINGS) ×1 IMPLANT
GAUZE SPONGE 4X4 12PLY STRL (GAUZE/BANDAGES/DRESSINGS) ×5 IMPLANT
GAUZE SPONGE 4X4 12PLY STRL LF (GAUZE/BANDAGES/DRESSINGS) ×2 IMPLANT
GLOVE BIO SURGEON STRL SZ7.5 (GLOVE) ×4 IMPLANT
GLOVE BIO SURGEON STRL SZ8 (GLOVE) ×5 IMPLANT
GLOVE BIO SURGEON STRL SZ8.5 (GLOVE) ×5 IMPLANT
GLOVE BIOGEL PI IND STRL 7.5 (GLOVE) IMPLANT
GLOVE BIOGEL PI INDICATOR 7.5 (GLOVE) ×4
GLOVE ECLIPSE 6.5 STRL STRAW (GLOVE) ×1 IMPLANT
GLOVE EXAM NITRILE LRG STRL (GLOVE) IMPLANT
GLOVE EXAM NITRILE XL STR (GLOVE) IMPLANT
GLOVE EXAM NITRILE XS STR PU (GLOVE) IMPLANT
GLOVE INDICATOR 7.0 STRL GRN (GLOVE) ×6 IMPLANT
GLOVE INDICATOR 8.5 STRL (GLOVE) ×2 IMPLANT
GLOVE OPTIFIT SS 7.0 STRL BRWN (GLOVE)
GLOVE OPTIFIT SS 7.5 STRL LX (GLOVE) IMPLANT
GLOVE SS BIOGEL STRL SZ 7.5 (GLOVE) ×1 IMPLANT
GLOVE SUPERSENSE BIOGEL SZ 7.5 (GLOVE) ×4
GLOVE SURG SS PI 7.0 STRL IVOR (GLOVE) ×4 IMPLANT
GOWN STRL REUS W/ TWL LRG LVL3 (GOWN DISPOSABLE) ×2 IMPLANT
GOWN STRL REUS W/ TWL XL LVL3 (GOWN DISPOSABLE) ×1 IMPLANT
GOWN STRL REUS W/TWL LRG LVL3 (GOWN DISPOSABLE) ×3
GOWN STRL REUS W/TWL XL LVL3 (GOWN DISPOSABLE) ×12
KIT BASIN OR (CUSTOM PROCEDURE TRAY) ×4 IMPLANT
KIT ROOM TURNOVER OR (KITS) ×4 IMPLANT
MARKER SKIN DUAL TIP RULER LAB (MISCELLANEOUS) ×3 IMPLANT
NDL HYPO 25GX1X1/2 BEV (NEEDLE) IMPLANT
NDL HYPO 25X1 1.5 SAFETY (NEEDLE) ×1 IMPLANT
NDL PRECISIONGLIDE 27X1.5 (NEEDLE) ×1 IMPLANT
NDL SPNL 22GX3.5 QUINCKE BK (NEEDLE) IMPLANT
NEEDLE HYPO 25GX1X1/2 BEV (NEEDLE) IMPLANT
NEEDLE HYPO 25X1 1.5 SAFETY (NEEDLE) ×3 IMPLANT
NEEDLE PRECISIONGLIDE 27X1.5 (NEEDLE) ×3 IMPLANT
NEEDLE SPNL 22GX3.5 QUINCKE BK (NEEDLE) IMPLANT
NS IRRIG 1000ML POUR BTL (IV SOLUTION) ×4 IMPLANT
OIL CARTRIDGE MAESTRO DRILL (MISCELLANEOUS)
PACK LAMINECTOMY NEURO (CUSTOM PROCEDURE TRAY) ×2 IMPLANT
PAD ARMBOARD 7.5X6 YLW CONV (MISCELLANEOUS) ×9 IMPLANT
PAD DRESSING TELFA 3X8 NADH (GAUZE/BANDAGES/DRESSINGS) ×1 IMPLANT
PATTIES SURGICAL .25X.25 (GAUZE/BANDAGES/DRESSINGS) ×1 IMPLANT
PATTIES SURGICAL .5 X.5 (GAUZE/BANDAGES/DRESSINGS) ×5 IMPLANT
PATTIES SURGICAL .5 X3 (DISPOSABLE) ×4 IMPLANT
PENCIL BUTTON HOLSTER BLD 10FT (ELECTRODE) ×3 IMPLANT
PENCIL FOOT CONTROL (ELECTRODE) IMPLANT
PIN MAYFIELD SKULL DISP (PIN) ×3 IMPLANT
RUBBERBAND STERILE (MISCELLANEOUS) ×6 IMPLANT
SPLINT NASAL DOYLE BI-VL (GAUZE/BANDAGES/DRESSINGS) ×2 IMPLANT
SPONGE GAUZE 2X2 STER 10/PKG (GAUZE/BANDAGES/DRESSINGS) ×2
SPONGE LAP 4X18 X RAY DECT (DISPOSABLE) ×1 IMPLANT
SPONGE SURGIFOAM ABS GEL SZ50 (HEMOSTASIS) ×3 IMPLANT
STAPLER SKIN PROX WIDE 3.9 (STAPLE) ×3 IMPLANT
STRIP CLOSURE SKIN 1/2X4 (GAUZE/BANDAGES/DRESSINGS) IMPLANT
STRIP CLOSURE SKIN 1/4X4 (GAUZE/BANDAGES/DRESSINGS) IMPLANT
SUT BONE WAX W31G (SUTURE) ×3 IMPLANT
SUT CHROMIC 3 0 PS 2 (SUTURE) ×2 IMPLANT
SUT CHROMIC 4 0 P 3 18 (SUTURE) ×2 IMPLANT
SUT CHROMIC 4 0 PS 2 18 (SUTURE) ×1 IMPLANT
SUT CHROMIC 5 0 P 3 (SUTURE) IMPLANT
SUT ETHILON 3 0 PS 1 (SUTURE) ×2 IMPLANT
SUT ETHILON 4 0 PS 2 18 (SUTURE) IMPLANT
SUT ETHILON 5 0 P 3 18 (SUTURE)
SUT ETHILON 6 0 P 1 (SUTURE) IMPLANT
SUT NOVAFIL 6 0 PRE 2 4412 13 (SUTURE) IMPLANT
SUT NYLON ETHILON 5-0 P-3 1X18 (SUTURE) IMPLANT
SUT PLAIN 4 0 ~~LOC~~ 1 (SUTURE) IMPLANT
SUT PROLENE 6 0 BV (SUTURE) IMPLANT
SUT SILK 2 0 FS (SUTURE) ×2 IMPLANT
SUT VIC AB 2-0 CP2 18 (SUTURE) ×2 IMPLANT
SUT VIC AB 2-0 CT1 27 (SUTURE)
SUT VIC AB 2-0 CT1 27XBRD (SUTURE) IMPLANT
SUT VIC AB 2-0 CT2 18 VCP726D (SUTURE) IMPLANT
SUT VIC AB 3-0 SH 8-18 (SUTURE) IMPLANT
SUT VIC AB 4-0 P-3 18X BRD (SUTURE) IMPLANT
SUT VIC AB 4-0 P3 18 (SUTURE)
SYR 5ML LL (SYRINGE) IMPLANT
SYR CONTROL 10ML LL (SYRINGE) ×3 IMPLANT
TAPE CLOTH SURG 4X10 WHT LF (GAUZE/BANDAGES/DRESSINGS) ×2 IMPLANT
TOWEL GREEN STERILE (TOWEL DISPOSABLE) ×3 IMPLANT
TOWEL GREEN STERILE FF (TOWEL DISPOSABLE) ×3 IMPLANT
TRAY ENT MC OR (CUSTOM PROCEDURE TRAY) ×2 IMPLANT
TRAY FOLEY W/METER SILVER 16FR (SET/KITS/TRAYS/PACK) ×3 IMPLANT
UNDERPAD 30X30 (UNDERPADS AND DIAPERS) ×3 IMPLANT
WATER STERILE IRR 1000ML POUR (IV SOLUTION) ×5 IMPLANT

## 2016-12-07 NOTE — Transfer of Care (Signed)
Immediate Anesthesia Transfer of Care Note  Patient: Cassandra Mcmanaman  Procedure(s) Performed: Procedure(s) with comments: TRANSPHENOIDAL RESECTION OF TUMOR (N/A) - TRANSPHENOIDAL RESECTION OF TUMOR TRANSNASAL APPROACH (N/A)  Patient Location: PACU  Anesthesia Type:General  Level of Consciousness: awake, drowsy, patient cooperative and responds to stimulation  Airway & Oxygen Therapy: Patient Spontanous Breathing and Patient connected to face mask oxygen  Post-op Assessment: Report given to RN, Post -op Vital signs reviewed and stable and Patient moving all extremities  Post vital signs: Reviewed and stable  Last Vitals:  Vitals:   12/07/16 0809 12/07/16 1340  BP: 134/84 (!) 153/90  Pulse: (!) 56 71  Resp: 20 12  Temp: 36.8 C 36.1 C    Last Pain:  Vitals:   12/07/16 0809  TempSrc: Oral         Complications: No apparent anesthesia complications

## 2016-12-07 NOTE — Progress Notes (Signed)
Pt very drowsy and doses off very easily.  Also having episodes of apnea and bradycardia dipping into the 40's.  Notified Dr. Arnoldo Morale of this.  STAT CT performed.

## 2016-12-07 NOTE — Progress Notes (Signed)
Patient ID: Neil [name], male   DOB: 21-May-1961, 56 y.o.   MRN: [id number] Subjective:  The patient is alert and pleasant. He is in no apparent distress. He says he feels like he can't breathe but I think this is mainly because of his packings and having to breathe through his mouth. He looks well.  Objective: Vital signs in last 24 hours: Temp:  [97 F (36.1 C)-98.2 F (36.8 C)] 97.6 F (36.4 C) (06/28 1600) Pulse Rate:  [56-101] 57 (06/28 1700) Resp:  [6-20] 12 (06/28 1700) BP: (134-169)/(84-133) 163/94 (06/28 1710) SpO2:  [99 %-100 %] 100 % (06/28 1700) Arterial Line BP: (145-179)/(81-98) 166/91 (06/28 1700) Weight:  [71.7 kg (158 lb)] 71.7 kg (158 lb) (06/28 1500)  Intake/Output from previous day: No intake/output data recorded. Intake/Output this shift: Total I/O In: 2310 [P.O.:75; I.V.:2235] Out: 440 [Urine:340; Blood:100]  Physical exam the patient is alert and oriented 3. He is moving all 4 extremities well. His speech is normal. His pupils are equal. His vision is normal.  I reviewed the patient's head CT performed this afternoon. He has had a resection of his pituitary tumor. There may be some residual tumor in the cephalad aspect. There is no hemorrhages, strokes, etc.  Lab Results: No results for input(s): WBC, HGB, HCT, PLT in the last 72 hours. BMET No results for input(s): NA, K, CL, CO2, GLUCOSE, BUN, CREATININE, CALCIUM in the last 72 hours.  Studies/Results: Ct Head Wo Contrast  Result Date: 12/07/2016 CLINICAL DATA:  Initial evaluation status post pituitary surgery. EXAM: CT HEAD WITHOUT CONTRAST TECHNIQUE: Contiguous axial images were obtained from the base of the skull through the vertex without intravenous contrast. COMPARISON:  Previous MRI from 05/03/2016. FINDINGS: Brain: Postoperative changes from interval transsphenoidal resection of pituitary macro adenoma seen. Postoperative pneumocephalus with probable fat packing present at the sellar resection  cavity. No complication identified. Stable cerebral volume. No evidence for acute intracranial hemorrhage. No evidence for acute large vessel territory infarct. No mass lesion, midline shift or mass effect. No hydrocephalus. No extra-axial fluid collection. Vascular: No hyperdense vessel. Scattered vascular calcifications noted within the carotid siphons. Skull: Scalp soft tissues within normal limits.  Calvarium intact. Sinuses/Orbits: Globes intact. Postoperative soft tissue emphysema of present within the right preseptal soft tissues as well as the right pre maxillary and retro antral fat. Packing material present within the nasal cavities bilaterally. Sphenoid sinuses are opacified with associated packing and/or postoperative changes. No significant hemosinus. Mastoids clear. IMPRESSION: 1. Postoperative changes from interval transsphenoidal resection of pituitary macro adenoma without complication. 2. Otherwise stable appearance of the brain. No other acute intracranial process. Electronically Signed   By: Jeannine Boga M.D.   On: 12/07/2016 16:19    Assessment/Plan: Status post transsphenoidal resection of pituitary macroadenoma. The patient is doing well.  LOS: 0 days     Damesha Lawler D 12/07/2016, 5:59 PM
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
## 2016-12-07 NOTE — Progress Notes (Signed)
Post OP check Awake   Alert VSS Doing well Nasal packs intact. Minimal bleeding Will plan on removing nasal packs Sat AM Stable post op course
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
## 2016-12-07 NOTE — Anesthesia Postprocedure Evaluation (Signed)
Anesthesia Post Note  Patient: Neil [name]  Procedure(s) Performed: Procedure(s) (LRB): TRANSPHENOIDAL RESECTION OF TUMOR (N/A) TRANSNASAL APPROACH (N/A)     Patient location during evaluation: PACU Anesthesia Type: General Level of consciousness: awake and alert Pain management: pain level controlled Vital Signs Assessment: post-procedure vital signs reviewed and stable Respiratory status: spontaneous breathing, nonlabored ventilation, respiratory function stable and patient connected to nasal cannula oxygen Cardiovascular status: blood pressure returned to baseline and stable Postop Assessment: no signs of nausea or vomiting Anesthetic complications: no    Last Vitals:  Vitals:   12/07/16 1415 12/07/16 1430  BP:    Pulse: (!) 58 62  Resp: (!) 6 12  Temp:      Last Pain:  Vitals:   12/07/16 1415  TempSrc:   PainSc: Asleep                 Effie Berkshire
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
## 2016-12-07 NOTE — Interval H&P Note (Signed)
History and Physical Interval Note:  12/07/2016 9:40 AM  Neil [name]  has presented today for surgery, with the diagnosis of PITUITARY MACROADENOMA WITH EXTRASELLAR EXTENSION  The various methods of treatment have been discussed with the patient and family. After consideration of risks, benefits and other options for treatment, the patient has consented to  Procedure(s) with comments: TRANSPHENOIDAL RESECTION OF TUMOR (N/A) - TRANSPHENOIDAL RESECTION OF TUMOR TRANSNASAL APPROACH (N/A) as a surgical intervention .  The patient's history has been reviewed, patient examined, no change in status, stable for surgery.  I have reviewed the patient's chart and labs.  Questions were answered to the patient's satisfaction.     CHRISTOPHER NEWMAN

## 2016-12-07 NOTE — Anesthesia Procedure Notes (Signed)
Arterial Line Insertion Start/End6/28/2018 10:00 AM Performed by: Docia Barrier, Azelyn Batie T, CRNA  Preanesthetic checklist: patient identified, IV checked, site marked, risks and benefits discussed, surgical consent, monitors and equipment checked, pre-op evaluation, timeout performed and anesthesia consent radial was placed Catheter size: 20 G Hand hygiene performed  and maximum sterile barriers used  Allen's test indicative of satisfactory collateral circulation Attempts: 2 Procedure performed without using ultrasound guided technique. Following insertion, dressing applied and Biopatch. Post procedure assessment: normal  Patient tolerated the procedure well with no immediate complications.

## 2016-12-07 NOTE — Anesthesia Procedure Notes (Signed)
Procedure Name: Intubation Date/Time: 12/07/2016 9:59 AM Performed by: Ollen Bowl Pre-anesthesia Checklist: Patient identified, Emergency Drugs available, Suction available, Patient being monitored and Timeout performed Patient Re-evaluated:Patient Re-evaluated prior to inductionOxygen Delivery Method: Circle system utilized Preoxygenation: Pre-oxygenation with 100% oxygen Intubation Type: IV induction Ventilation: Mask ventilation without difficulty Laryngoscope Size: Mac and 4 Grade View: Grade II Tube type: Oral Tube size: 7.5 mm Number of attempts: 1 Airway Equipment and Method: Stylet Placement Confirmation: ETT inserted through vocal cords under direct vision,  positive ETCO2 and breath sounds checked- equal and bilateral Secured at: 22 cm Tube secured with: Tape Dental Injury: Teeth and Oropharynx as per pre-operative assessment  Comments: Placed by Dicie Beam SRNA

## 2016-12-07 NOTE — Progress Notes (Signed)
eLink Physician-Brief Progress Note Patient Name: Neil [name] DOB: Apr 12, 1961 MRN: [id number]   Date of Service  12/07/2016  HPI/Events of Note  56 y.o. Male admitted for pituitary macroadenoma resection. Extubated post-op. Multiple medical problems. Patient with mild hypertension. Arterial line place. Resting comfortably on camera check.   eICU Interventions  Continuing post-op plan of care as per primary service.      Intervention Category Evaluation Type: New Patient Evaluation  Tera Partridge 12/07/2016, 4:23 PM

## 2016-12-07 NOTE — Anesthesia Preprocedure Evaluation (Addendum)
Anesthesia Evaluation  Patient identified by MRN, date of birth, ID band Patient awake    Reviewed: Allergy & Precautions, NPO status , Patient's Chart, lab work & pertinent test results  Airway Mallampati: III  TM Distance: >3 FB Neck ROM: Full    Dental  (+) Teeth Intact, Dental Advisory Given   Pulmonary Current Smoker,    breath sounds clear to auscultation       Cardiovascular negative cardio ROS   Rhythm:Regular Rate:Normal     Neuro/Psych  Headaches, PSYCHIATRIC DISORDERS Anxiety Depression Bipolar Disorder Schizophrenia  Neuromuscular disease    GI/Hepatic negative GI ROS, Neg liver ROS,   Endo/Other  Hypothyroidism   Renal/GU negative Renal ROS  negative genitourinary   Musculoskeletal negative musculoskeletal ROS (+)   Abdominal Normal abdominal exam  (+)   Peds negative pediatric ROS (+)  Hematology negative hematology ROS (+)   Anesthesia Other Findings - HLD  Reproductive/Obstetrics negative OB ROS                            Anesthesia Physical Anesthesia Plan  ASA: II  Anesthesia Plan: General   Post-op Pain Management:    Induction: Intravenous  PONV Risk Score and Plan: 2 and Ondansetron and Dexamethasone  Airway Management Planned: Oral ETT  Additional Equipment:   Intra-op Plan:   Post-operative Plan: Extubation in OR  Informed Consent: I have reviewed the patients History and Physical, chart, labs and discussed the procedure including the risks, benefits and alternatives for the proposed anesthesia with the patient or authorized representative who has indicated his/her understanding and acceptance.   Dental advisory given  Plan Discussed with: CRNA  Anesthesia Plan Comments:         Anesthesia Quick Evaluation

## 2016-12-08 ENCOUNTER — Encounter (HOSPITAL_COMMUNITY): Payer: Self-pay | Admitting: Neurosurgery

## 2016-12-08 LAB — BASIC METABOLIC PANEL
ANION GAP: 10 (ref 5–15)
BUN: 17 mg/dL (ref 6–20)
CO2: 23 mmol/L (ref 22–32)
Calcium: 9.7 mg/dL (ref 8.9–10.3)
Chloride: 104 mmol/L (ref 101–111)
Creatinine, Ser: 1.14 mg/dL (ref 0.61–1.24)
Glucose, Bld: 146 mg/dL — ABNORMAL HIGH (ref 65–99)
POTASSIUM: 4.2 mmol/L (ref 3.5–5.1)
Sodium: 137 mmol/L (ref 135–145)

## 2016-12-08 LAB — SODIUM: SODIUM: 137 mmol/L (ref 135–145)

## 2016-12-08 LAB — SODIUM, URINE, RANDOM: Sodium, Ur: 29 mmol/L

## 2016-12-08 MED ORDER — PANTOPRAZOLE SODIUM 40 MG PO TBEC
40.0000 mg | DELAYED_RELEASE_TABLET | Freq: Every day | ORAL | Status: DC
  Administered 2016-12-08: 40 mg via ORAL
  Filled 2016-12-08: qty 1

## 2016-12-08 MED ORDER — DEXAMETHASONE 4 MG PO TABS: 4.0000 mg | ORAL_TABLET | Freq: Three times a day (TID) | ORAL | Status: DC

## 2016-12-08 MED ORDER — DEXAMETHASONE 4 MG PO TABS
4.0000 mg | ORAL_TABLET | Freq: Three times a day (TID) | ORAL | Status: DC
Start: 2016-12-08 — End: 2016-12-08

## 2016-12-08 MED ORDER — DEXAMETHASONE 4 MG PO TABS
4.0000 mg | ORAL_TABLET | Freq: Four times a day (QID) | ORAL | Status: DC
  Administered 2016-12-08 – 2016-12-09 (×3): 4 mg via ORAL
  Filled 2016-12-08 (×3): qty 1

## 2016-12-08 NOTE — Progress Notes (Signed)
MD rounded on pt, aware of normal sodium 137 and previous specific gravity of 1.003 per night RN.  Patient currently able to successfully void without foley catheter and is drinking fluids. Orders received to mobilize pt and d/c art line. Will continue to monitor.

## 2016-12-08 NOTE — Care Management Note (Signed)
Case Management Note  Patient Details  Name: Neil [name] MRN: [id number] Date of Birth: August 27, 1960  Subjective/Objective:  Pt admitted on 12/07/16 s/p transnasal transsphenoidal resection of pituitary tumor.  PTA, pt independent of ADLS.                     Action/Plan: Will follow for discharge planning as pt progresses.   Expected Discharge Date:                  Expected Discharge Plan:  Home/Self Care  In-House Referral:     Discharge planning Services  CM Consult  Post Acute Care Choice:    Choice offered to:     DME Arranged:    DME Agency:     HH Arranged:    HH Agency:     Status of Service:  In process, will continue to follow  If discussed at Long Length of Stay Meetings, dates discussed:    Additional Comments:  Ella Bodo, RN 12/08/2016, 5:16 PM

## 2016-12-08 NOTE — Progress Notes (Signed)
Called the on call MD for neurosurgery again, Md made aware of the increased urine output, orders placed for a serum sodium levels to be drawn.  Will cont to monitor

## 2016-12-08 NOTE — Progress Notes (Signed)
Patient ID: Neil [name], male   DOB: 01/05/1961, 56 y.o.   MRN: [id number] Subjective:  The patient is alert and pleasant. He looks well. He is in no apparent distress.  Objective: Vital signs in last 24 hours: Temp:  [97 F (36.1 C)-98.2 F (36.8 C)] 98 F (36.7 C) (06/29 0300) Pulse Rate:  [46-101] 63 (06/29 0700) Resp:  [6-20] 10 (06/29 0700) BP: (119-169)/(79-133) 123/86 (06/29 0700) SpO2:  [97 %-100 %] 100 % (06/29 0700) Arterial Line BP: (140-179)/(68-98) 151/70 (06/29 0700) Weight:  [71.7 kg (158 lb)] 71.7 kg (158 lb) (06/28 1500)  Intake/Output from previous day: 06/28 0701 - 06/29 0700 In: 4610 [P.O.:675; I.V.:3735; IV Piggyback:200] Out: 8242 [Urine:3340; Blood:100] Intake/Output this shift: Total I/O In: -  Out: 400 [Urine:400]  Physical exam the patient is alert and oriented. His speech is normal. His vision is normal. There is minimal drainage from his nose.  Lab Results: No results for input(s): WBC, HGB, HCT, PLT in the last 72 hours. BMET  Recent Labs  12/08/16 0617  NA 137    Studies/Results: Ct Head Wo Contrast  Result Date: 12/07/2016 CLINICAL DATA:  Initial evaluation status post pituitary surgery. EXAM: CT HEAD WITHOUT CONTRAST TECHNIQUE: Contiguous axial images were obtained from the base of the skull through the vertex without intravenous contrast. COMPARISON:  Previous MRI from 05/03/2016. FINDINGS: Brain: Postoperative changes from interval transsphenoidal resection of pituitary macro adenoma seen. Postoperative pneumocephalus with probable fat packing present at the sellar resection cavity. No complication identified. Stable cerebral volume. No evidence for acute intracranial hemorrhage. No evidence for acute large vessel territory infarct. No mass lesion, midline shift or mass effect. No hydrocephalus. No extra-axial fluid collection. Vascular: No hyperdense vessel. Scattered vascular calcifications noted within the carotid siphons. Skull: Scalp  soft tissues within normal limits.  Calvarium intact. Sinuses/Orbits: Globes intact. Postoperative soft tissue emphysema of present within the right preseptal soft tissues as well as the right pre maxillary and retro antral fat. Packing material present within the nasal cavities bilaterally. Sphenoid sinuses are opacified with associated packing and/or postoperative changes. No significant hemosinus. Mastoids clear. IMPRESSION: 1. Postoperative changes from interval transsphenoidal resection of pituitary macro adenoma without complication. 2. Otherwise stable appearance of the brain. No other acute intracranial process. Electronically Signed   By: Jeannine Boga M.D.   On: 12/07/2016 16:19    Assessment/Plan: Postop day #1: The patient is doing well. He had high urine output last evening, but his sodium is normal. His I/O are approximately 1 L positive. This may be a natural postoperative diuresis versus early DI. I'll check his sodium this afternoon and tomorrow morning. He will likely be able to go home over the weekend.  LOS: 1 day     Ruqayya Ventress D 12/08/2016, 7:57 AM

## 2016-12-08 NOTE — Progress Notes (Signed)
Time is 0240 . Pt experiencing recent increase in UO, >700 since 0000, called the on call MD,  Serum osmolarity 1.007, will cont to monitor.   Time is now 0315, I have paged the on call MD again, no response. Pt cont to have increased UO  Time is 0335, I have called the on call MD for the 3rd time, no response tus far.   Time is 0400, called again, no response. Serum osmolarity repeated, it is now 1.003, will cont to monitor.

## 2016-12-08 NOTE — Progress Notes (Signed)
POD 1 AF VSS No significant bleeding noted. Packing intact. No airway problems Will plan on removing nasal packing tomorrow am.

## 2016-12-08 NOTE — Progress Notes (Addendum)
Patient negative 1.39 liters for shift total. IV is saline locked, and patient has been able to take POs. Sodium level 137, unchanged from this AM. Urine output has been yellow, clear. Repeat BMET ordered for AM. PA aware, will continue to monitor.

## 2016-12-09 LAB — BASIC METABOLIC PANEL
Anion gap: 7 (ref 5–15)
BUN: 15 mg/dL (ref 6–20)
CALCIUM: 9.5 mg/dL (ref 8.9–10.3)
CO2: 26 mmol/L (ref 22–32)
CREATININE: 1.2 mg/dL (ref 0.61–1.24)
Chloride: 106 mmol/L (ref 101–111)
GFR calc Af Amer: >60 mL/min (ref >60)
GFR calc non Af Amer: >60 mL/min (ref >60)
GLUCOSE: 143 mg/dL — AB (ref 65–99)
Potassium: 4.2 mmol/L (ref 3.5–5.1)
Sodium: 139 mmol/L (ref 135–145)

## 2016-12-09 MED ORDER — HYDROCODONE-ACETAMINOPHEN 5-325 MG PO TABS: 1.0000 | ORAL_TABLET | ORAL | 0 refills | Status: DC | PRN

## 2016-12-09 MED ORDER — HYDROCORTISONE 10 MG PO TABS: 10.0000 mg | ORAL_TABLET | Freq: Every day | ORAL | 0 refills | Status: DC

## 2016-12-09 NOTE — Discharge Summary (Signed)
Physician Discharge Summary  Patient ID: Neil [name] MRN: [id number] DOB/AGE: February 02, 1961 56 y.o.  Admit date: 12/07/2016 Discharge date: 12/09/2016  Admission Diagnoses:  Pituitary macroadenoma  Discharge Diagnoses:  Same Active Problems:   Pituitary macroadenoma with extrasellar extension [facility])  Discharged Condition: Stable  Hospital Course:  Neil [name] is a 56 y.o. male who was admitted for the below procedure. Post operatively, there was concern about DI, but patient's sodium remained normal. Will keep on hydrocrotisone to prevent addison crisis. Pt to f/u with Dr Arnoldo Morale and Dr Lucia Gaskins.  At time of discharge, pain was well controlled, ambulating with Pt/OT, tolerating po, voiding normal. Ready for discharge.  Treatments: Surgery Transsphenoidal resection of pituitary macroadenoma using microdissection, harvesting of right abdominal fat graft through a separate incision  Discharge Exam: Blood pressure (!) 130/92, pulse 68, temperature 98.2 F (36.8 C), resp. rate 12, height 5\' 8"  (1.727 m), weight 71.7 kg (158 lb), SpO2 99 %. Awake, alert, oriented Speech fluent, appropriate CN grossly intact MAEW Wound c/d/i  Disposition: 01-Home or Self Care  Discharge Instructions    Call MD for:  difficulty breathing, headache or visual disturbances    Complete by:  As directed    Call MD for:  persistant dizziness or light-headedness    Complete by:  As directed    Call MD for:  redness, tenderness, or signs of infection (pain, swelling, redness, odor or green/yellow discharge around incision site)    Complete by:  As directed    Call MD for:  severe uncontrolled pain    Complete by:  As directed    Call MD for:  temperature >100.4    Complete by:  As directed    Diet general    Complete by:  As directed    Driving Restrictions    Complete by:  As directed    Do not drive until given clearance.   Increase activity slowly    Complete by:  As directed    Lifting  restrictions    Complete by:  As directed    Do not lift anything >10lbs. Avoid bending and twisting in awkward positions. Avoid bending at the back.   Remove dressing in 24 hours    Complete by:  As directed      Allergies as of 12/09/2016      Reactions   No Known Allergies       Medication List    TAKE these medications   acetaminophen 500 MG tablet Commonly known as:  TYLENOL Take 250 mg by mouth daily as needed for moderate pain or headache.   CLEAR EYES OP Apply 1 drop to eye daily as needed (dry eyes).   HYDROcodone-acetaminophen 5-325 MG tablet Commonly known as:  NORCO/VICODIN Take 1 tablet by mouth every 4 (four) hours as needed for moderate pain.   hydrocortisone 10 MG tablet Commonly known as:  CORTEF Take 1 tablet (10 mg total) by mouth daily.   levothyroxine 25 MCG tablet Commonly known as:  SYNTHROID, LEVOTHROID Take 1 tablet (25 mcg total) by mouth daily before breakfast.      Follow-up Information    Rozetta Nunnery, MD. Schedule an appointment as soon as possible for a visit in 9 day(s).   Specialty:  Otolaryngology Why:  For wound re-check Contact information: [place] [address] [phone]        Newman Pies, MD. Schedule an appointment as soon as possible for a visit.   Specialty:  Neurosurgery Why:  Call Monday to schedule your first post operative visit Contact information: [address] [place] [address] [place] [address] [phone]           Signed: Traci Sermon 12/09/2016, 8:57 AM

## 2016-12-09 NOTE — Progress Notes (Signed)
Per ENT,  Pt no longer needing Ancef. Neurosurgery aware.

## 2016-12-09 NOTE — Progress Notes (Signed)
Patient given and reviewed d/c instructions and copy given with signature sheet placed in chart. Reviewed medications prescribed/continued and where to get them. Prescription for Norco given to patient and reviewed. Patient aware not to drive or lift any heavy objects, per ENT pt can blow nose lightly and can drink from straws. Pt also aware how to monitor for s/s of CSF leak and infection and when to f/u with MDs. Questions answered. Patient dressed, PIVs removed. Patient d/c home with daughter via wheelchair/NT escort.

## 2016-12-09 NOTE — Progress Notes (Signed)
POD 2 AF this am   Awake and alert Nasal packing removed this am No significant bleeding or drainage noted. Will have patient follow up in 9-10 days to have septal splints removed. Breathing well through both sides. Stable post op course.

## 2016-12-09 NOTE — Progress Notes (Signed)
Pt seen and examined. No issues overnight. Some polydypsia and polyuria, but keeping up.  EXAM: Temp:  [97.7 F (36.5 C)-100.7 F (38.2 C)] 98.2 F (36.8 C) (06/30 0400) Pulse Rate:  [59-105] 68 (06/30 0800) Resp:  [7-22] 12 (06/30 0800) BP: (102-144)/(58-94) 130/92 (06/30 0800) SpO2:  [98 %-100 %] 99 % (06/30 0800) Intake/Output      06/29 0701 - 06/30 0700 06/30 0701 - 07/01 0700   P.O. 1995    I.V. (mL/kg) 110 (1.5)    IV Piggyback 300    Total Intake(mL/kg) 2405 (33.5)    Urine (mL/kg/hr) 4825 (2.8) 175 (1)   Blood     Total Output 4825 175   Net -2420 -175         Awake, alert, oriented Moving all extremities well  LABS: Lab Results  Component Value Date   CREATININE 1.20 12/09/2016   BUN 15 12/09/2016   NA 139 12/09/2016   K 4.2 12/09/2016   CL 106 12/09/2016   CO2 26 12/09/2016   Lab Results  Component Value Date   WBC 6.6 11/28/2016   HGB 12.0 (L) 11/28/2016   HCT 37.2 (L) 11/28/2016   MCV 93.5 11/28/2016   PLT 325 11/28/2016    IMPRESSION: - 56 y.o. male s/p transsphenoidal pituitary adenoma resection; neuro stable.  Labs stable.  PLAN: - D/c home - Will give 10mg  of hydrocortisone daily

## 2017-01-12 ENCOUNTER — Encounter (HOSPITAL_COMMUNITY): Payer: Self-pay | Admitting: *Deleted

## 2017-01-12 ENCOUNTER — Emergency Department (HOSPITAL_COMMUNITY)
Admission: EM | Admit: 2017-01-12 | Discharge: 2017-01-12 | Disposition: A | Discharge status: Home / Self Care | Payer: Medicaid Other | Attending: Emergency Medicine | Admitting: Emergency Medicine

## 2017-01-12 ENCOUNTER — Emergency Department (HOSPITAL_COMMUNITY): Payer: Medicaid Other

## 2017-01-12 DIAGNOSIS — F17210 Nicotine dependence, cigarettes, uncomplicated: Secondary | ICD-10-CM | POA: Insufficient documentation

## 2017-01-12 DIAGNOSIS — E039 Hypothyroidism, unspecified: Secondary | ICD-10-CM | POA: Insufficient documentation

## 2017-01-12 DIAGNOSIS — I10 Essential (primary) hypertension: Secondary | ICD-10-CM | POA: Diagnosis not present

## 2017-01-12 DIAGNOSIS — R61 Generalized hyperhidrosis: Secondary | ICD-10-CM

## 2017-01-12 DIAGNOSIS — R21 Rash and other nonspecific skin eruption: Secondary | ICD-10-CM

## 2017-01-12 LAB — CBC WITH DIFFERENTIAL/PLATELET
Basophils Absolute: 0 10*3/uL (ref 0.0–0.1)
Basophils Relative: 0 %
Eosinophils Absolute: 0.1 10*3/uL (ref 0.0–0.7)
Eosinophils Relative: 3 %
HCT: 35.9 % — ABNORMAL LOW (ref 39.0–52.0)
Hemoglobin: 11.7 g/dL — ABNORMAL LOW (ref 13.0–17.0)
Lymphocytes Relative: 60 %
Lymphs Abs: 2.6 10*3/uL (ref 0.7–4.0)
MCH: 30.2 pg (ref 26.0–34.0)
MCHC: 32.6 g/dL (ref 30.0–36.0)
MCV: 92.8 fL (ref 78.0–100.0)
Monocytes Absolute: 0.3 10*3/uL (ref 0.1–1.0)
Monocytes Relative: 8 %
Neutro Abs: 1.3 10*3/uL — ABNORMAL LOW (ref 1.7–7.7)
Neutrophils Relative %: 29 %
Platelets: 288 10*3/uL (ref 150–400)
RBC: 3.87 MIL/uL — ABNORMAL LOW (ref 4.22–5.81)
RDW: 14.3 % (ref 11.5–15.5)
WBC: 4.4 10*3/uL (ref 4.0–10.5)

## 2017-01-12 LAB — BASIC METABOLIC PANEL
ANION GAP: 5 (ref 5–15)
BUN: 10 mg/dL (ref 6–20)
CO2: 26 mmol/L (ref 22–32)
Calcium: 9.7 mg/dL (ref 8.9–10.3)
Chloride: 108 mmol/L (ref 101–111)
Creatinine, Ser: 1.12 mg/dL (ref 0.61–1.24)
GFR calc Af Amer: >60 mL/min (ref >60)
GLUCOSE: 98 mg/dL (ref 65–99)
POTASSIUM: 4.2 mmol/L (ref 3.5–5.1)
Sodium: 139 mmol/L (ref 135–145)

## 2017-01-12 LAB — URINALYSIS, ROUTINE W REFLEX MICROSCOPIC
Bilirubin Urine: NEGATIVE
Glucose, UA: NEGATIVE mg/dL
Hgb urine dipstick: NEGATIVE
Ketones, ur: NEGATIVE mg/dL
Leukocytes, UA: NEGATIVE
Nitrite: NEGATIVE
Protein, ur: NEGATIVE mg/dL
Specific Gravity, Urine: 1.012 (ref 1.005–1.030)
pH: 5 (ref 5.0–8.0)

## 2017-01-12 MED ORDER — HYDROCORTISONE 1 % EX CREA: TOPICAL_CREAM | CUTANEOUS | 0 refills | Status: DC

## 2017-01-12 MED ORDER — DIPHENHYDRAMINE HCL 25 MG PO CAPS: 25.0000 mg | ORAL_CAPSULE | Freq: Four times a day (QID) | ORAL | 0 refills | Status: DC | PRN

## 2017-01-12 NOTE — Discharge Instructions (Signed)
We do not know specifically what is causing your rash, but it does appear to be an allergic reaction rather than an infection or an indication of a more severe underlying illness.  Please take any prescribed medications as written and continue taking your normal prescription medications unless otherwise specified.  Follow up with the recommended physicians and return to the emergency department with any new or worsening symptoms that concern you, including but not limited to fever, lesions inside your mouth, etc. ° °

## 2017-01-12 NOTE — ED Provider Notes (Signed)
Emergency Department Provider Note   I have reviewed the triage vital signs and the nursing notes.   HISTORY  Chief Complaint Hypertension   HPI Neil [name] is a 56 y.o. male with PMH of Bipolar disorder, HLD, hypothyroidism, and recent resection of pituitary adenoma on 12/07/2016 presents to the emergency department for evaluation of night sweats and intermittent rash since the time of surgery. The patient reports waking up at night covered in sweat and notices an itchy rash over his neck and arms. No fevers or chills. No vision changes. He has had some associated elevated blood pressures and is not on any kind of blood pressure medication. He has any new medications. He had a recent HIV test in June of this year which was negative and has the results to review at bedside. He is discussed with issues with the neurosurgery team who referred him to endocrinology following his pituitary adenoma removal. Denies any CP or SOB.    Past Medical History:  Diagnosis Date  . Anxiety   . Bipolar disorder [facility]) age 22 yo  . Chest pain   . Depression   . Dyslipidemia, goal LDL below 100   . Headache   . Hypothyroidism   . Schizophrenia [facility]) age 44 yo  . 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  . Tobacco abuse     Patient Active Problem List   Diagnosis Date Noted  . Pituitary macroadenoma with extrasellar extension ([place]) 12/07/2016  . Pituitary macroadenoma ([place]) 08/21/2016  . Tobacco abuse   . Dyslipidemia, goal LDL below 100   . Sebaceous cyst of left axilla 06/08/2015  . Boil of trunk 06/08/2015  . Healthcare maintenance 06/08/2015  . Lumbar radiculopathy 03/16/2015  . Urinary hesitancy 03/16/2015  . Hyperlipidemia 03/05/2015  . Chest pain 03/05/2015  . Depression 11/02/2014  . Schizophrenia ([place]) 11/02/2014    Past Surgical History:  Procedure Laterality Date  . CRANIOTOMY N/A  12/07/2016   Procedure: TRANSPHENOIDAL RESECTION OF TUMOR;  Surgeon: Newman Pies, MD;  Location: [facility];  Service: Neurosurgery;  Laterality: N/A;  TRANSPHENOIDAL RESECTION OF TUMOR  . HEMORROIDECTOMY  2007  . TRANSNASAL APPROACH N/A 12/07/2016   Procedure: TRANSNASAL APPROACH;  Surgeon: Rozetta Nunnery, MD;  Location: [facility];  Service: ENT;  Laterality: N/A;    Current Outpatient Rx  . Order #: [id number] Class: Historical Med  . Order #: [id number] Class: Print  . Order #: [id number] Class: Print  . Order #: [id number] Class: Normal  . Order #: [id number] Class: Print  . Order #: [id number] Class: Normal  . Order #: [id number] Class: Historical Med    Allergies No known allergies  Family History  Problem Relation Age of Onset  . Hypertension Mother   . Stroke Mother   . Mental retardation Mother   . Heart failure Father   . Hypertension Sister   . Bipolar disorder Son   . Schizophrenia Son     Social History Social History  Substance Use Topics  . Smoking status: Current Every Day Smoker    Packs/day: 0.50    Years: 40.00    Types: Cigarettes  . Smokeless tobacco: Never Used     Comment: pt denies, quit smoking 12/05/16  . Alcohol use No    Review of Systems  Constitutional: No fever/chills. Positive night sweats.  Eyes: No visual changes. ENT: No sore throat. Cardiovascular: Denies chest pain. Respiratory: Denies shortness of breath. Gastrointestinal: No  abdominal pain.  No nausea, no vomiting.  No diarrhea.  No constipation. Genitourinary: Negative for dysuria. Musculoskeletal: Negative for back pain.  Skin: Positive for rash. Neurological: Negative for headaches, focal weakness or numbness.  10-point ROS otherwise negative.  ____________________________________________   PHYSICAL EXAM:  VITAL SIGNS: ED Triage Vitals [01/12/17 0909]  Enc Vitals Group     BP 132/87     Pulse Rate 64     Resp 14     Temp (!) 97.5 F (36.4 C)     Temp Source Oral      SpO2 100 %    Constitutional: Alert and oriented. Well appearing and in no acute distress. Eyes: Conjunctivae are normal.  Head: Atraumatic. Nose: No congestion/rhinnorhea. Mouth/Throat: Mucous membranes are moist.  Neck: No stridor.  Cardiovascular: Normal rate, regular rhythm. Good peripheral circulation. Grossly normal heart sounds.   Respiratory: Normal respiratory effort.  No retractions. Lungs CTAB. Gastrointestinal: Soft and nontender. No distention.  Musculoskeletal: No lower extremity tenderness nor edema. No gross deformities of extremities. Neurologic:  Normal speech and language. No gross focal neurologic deficits are appreciated.  Skin:  Skin is warm, dry and intact. Faint, sporadic erythematous, palpable, non-patechial rash over chest, arms, and neck. No pustules. No surrounding erythema or cellulitis. No fluctuance.   ____________________________________________   LABS (all labs ordered are listed, but only abnormal results are displayed)  Labs Reviewed  CBC WITH DIFFERENTIAL/PLATELET - Abnormal; Notable for the following:       Result Value   RBC 3.87 (*)    Hemoglobin 11.7 (*)    HCT 35.9 (*)    Neutro Abs 1.3 (*)    All other components within normal limits  BASIC METABOLIC PANEL  URINALYSIS, ROUTINE W REFLEX MICROSCOPIC   ____________________________________________  EKG   EKG Interpretation  Date/Time:  Friday January 12 2017 10:28:43 EDT Ventricular Rate:  53 PR Interval:    QRS Duration: 95 QT Interval:  497 QTC Calculation: 467 R Axis:   87 Text Interpretation:  Sinus rhythm Borderline T wave abnormalities No STEMI.  Confirmed by Nanda Quinton [id number]) on 01/12/2017 10:44:58 AM       ____________________________________________  RADIOLOGY  Dg Chest 2 View  Result Date: 01/12/2017 CLINICAL DATA:  Night sweats and hypoxemia EXAM: CHEST  2 VIEW COMPARISON:  11/02/2015 FINDINGS: Normal heart size. Lungs clear. No pneumothorax. No pleural effusion.  IMPRESSION: No active cardiopulmonary disease. Electronically Signed   By: Marybelle Killings M.D.   On: 01/12/2017 10:29    ____________________________________________   PROCEDURES  Procedure(s) performed:   Procedures  None ____________________________________________   INITIAL IMPRESSION / ASSESSMENT AND PLAN / ED COURSE  Pertinent labs & imaging results that were available during my care of the patient were reviewed by me and considered in my medical decision making (see chart for details).  Patient presents to the emergency department for evaluation of night sweats and rash. He's had some elevated blood pressures but none today in the emergency department. No dyspnea or chest pain. He did have an isolated oxygen saturation of 89% which may have been an erroneous value. During my exam the patient maintains normal oxygen saturation with no supplemental oxygen. However, with night sweats, plan to obtain chest x-ray addition to baseline labs, EKG, UA and reassess. Has nonreactive HIV test results with him at bedside. With recent pituitary surgery agree with plan for outpatient endocrine follow up.   12:00 PM Labs, EKG, chest x-ray are normal. Discussed primary care physician follow-up for  endocrinology referral. Suspect that the patient's oxygen saturation is recorded in error as he continues to have normal oxygen saturation at discharge. Chest x-ray normal.  At this time, I do not feel there is any life-threatening condition present. I have reviewed and discussed all results (EKG, imaging, lab, urine as appropriate), exam findings with patient. I have reviewed nursing notes and appropriate previous records.  I feel the patient is safe to be discharged home without further emergent workup. Discussed usual and customary return precautions. Patient and family (if present) verbalize understanding and are comfortable with this plan.  Patient will follow-up with their primary care provider. If they  do not have a primary care provider, information for follow-up has been provided to them. All questions have been answered.  ____________________________________________  FINAL CLINICAL IMPRESSION(S) / ED DIAGNOSES  Final diagnoses:  Rash and nonspecific skin eruption  Hypertension, unspecified type  Night sweats     MEDICATIONS GIVEN DURING THIS VISIT:  Medications - No data to display   NEW OUTPATIENT MEDICATIONS STARTED DURING THIS VISIT:  New Prescriptions   DIPHENHYDRAMINE (BENADRYL) 25 MG CAPSULE    Take 1 capsule (25 mg total) by mouth every 6 (six) hours as needed for itching.   HYDROCORTISONE CREAM 1 %    Apply to affected area 2 times daily for 7 days.      Note:  This document was prepared using Dragon voice recognition software and may include unintentional dictation errors.  Nanda Quinton, MD Emergency Medicine    Long, [facility], MD 01/12/17 [phone]

## 2017-01-12 NOTE — ED Triage Notes (Signed)
Pt requesting bp check due to recent surgery and having occ diaphoresis and rash. bp is 132/87 at triage, no acute distress is noted.

## 2017-01-24 ENCOUNTER — Other Ambulatory Visit: Payer: Self-pay | Admitting: Endocrinology

## 2017-01-29 ENCOUNTER — Encounter (HOSPITAL_COMMUNITY): Payer: Self-pay | Admitting: Neurosurgery
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
## 2017-02-02 ENCOUNTER — Encounter: Payer: Self-pay | Admitting: Endocrinology

## 2017-02-02 ENCOUNTER — Ambulatory Visit (INDEPENDENT_AMBULATORY_CARE_PROVIDER_SITE_OTHER): Payer: Medicaid Other | Admitting: Endocrinology

## 2017-02-02 VITALS — BP 104/74 | HR 73 | Ht 68.75 in | Wt 155.0 lb

## 2017-02-02 DIAGNOSIS — D352 Benign neoplasm of pituitary gland: Secondary | ICD-10-CM

## 2017-02-02 LAB — CORTISOL: Cortisol, Plasma: 8.1 ug/dL

## 2017-02-02 LAB — TESTOSTERONE: Testosterone: 8.91 ng/dL — ABNORMAL LOW (ref 300.00–890.00)

## 2017-02-02 LAB — T4, FREE: Free T4: 0.67 ng/dL (ref 0.60–1.60)

## 2017-02-04 ENCOUNTER — Other Ambulatory Visit: Payer: Self-pay | Admitting: Endocrinology

## 2017-02-04 DIAGNOSIS — E230 Hypopituitarism: Principal | ICD-10-CM | POA: Insufficient documentation

## 2017-02-04 NOTE — Progress Notes (Signed)
Please call Re: Labs: Testosterone hormone is very low and needs to start applying AndroGel 5 g daily, may need to confirm coverage for this Also needs to increase levothyroxine to 50 g his thyroid level is low

## 2017-02-06 ENCOUNTER — Other Ambulatory Visit: Payer: Self-pay

## 2017-02-06 LAB — PROLACTIN: PROLACTIN: 8.5 ng/mL (ref 4.0–15.2)

## 2017-02-06 MED ORDER — LEVOTHYROXINE SODIUM 50 MCG PO TABS: 50.0000 ug | ORAL_TABLET | Freq: Every day | ORAL | 3 refills | Status: DC

## 2017-02-06 MED ORDER — TESTOSTERONE 50 MG/5GM (1%) TD GEL: 5.0000 g | Freq: Every day | TRANSDERMAL | 3 refills | Status: DC

## 2017-02-07 ENCOUNTER — Telehealth: Payer: Self-pay | Admitting: Endocrinology

## 2017-02-07 ENCOUNTER — Telehealth: Payer: Self-pay

## 2017-02-07 NOTE — Telephone Encounter (Signed)
Patient called about his testosterone (ANDROGEL) 50 MG/5GM (1%) GEL. I advised the patient that Endo did send the medication and to call the pharmacy to check on the status and to call back if the nurse needs to send again.

## 2017-02-07 NOTE — Telephone Encounter (Signed)
Patient called and stated that he just called the pharmacy and they still do not have the testosterone (ANDROGEL) 50 MG/5GM (1%) GEL  Please fax a third time to  [place], [place] [phone] (Phone) [phone] (Fax)   Call patient once this is done on our end.

## 2017-02-07 NOTE — Telephone Encounter (Signed)
Routing to you °

## 2017-02-07 NOTE — Telephone Encounter (Signed)
Refill for andr todayogel was faxed

## 2017-02-07 NOTE — Telephone Encounter (Signed)
This was ordered today.

## 2017-02-08 NOTE — Telephone Encounter (Signed)
Routing to you °

## 2017-02-08 NOTE — Telephone Encounter (Signed)
Patient needs to know how to use the testosterone (ANDROGEL) 50 MG/5GM (1%) GEL. Call patient to advise. I also told the patient to call the pharmacy and they can also tell him how to use based on the directions given by the provider.

## 2017-02-19 ENCOUNTER — Other Ambulatory Visit: Payer: Self-pay

## 2017-02-19 NOTE — Telephone Encounter (Signed)
This was ordered on 02/06/17

## 2017-02-21 ENCOUNTER — Ambulatory Visit: Payer: Medicaid Other | Admitting: Internal Medicine

## 2017-03-13 ENCOUNTER — Encounter: Payer: Self-pay | Admitting: Internal Medicine

## 2017-03-13 ENCOUNTER — Ambulatory Visit (INDEPENDENT_AMBULATORY_CARE_PROVIDER_SITE_OTHER): Payer: Medicaid Other | Admitting: Internal Medicine

## 2017-03-13 VITALS — BP 122/82 | HR 70 | Resp 12 | Ht 68.0 in | Wt 148.0 lb

## 2017-03-13 DIAGNOSIS — E291 Testicular hypofunction: Secondary | ICD-10-CM | POA: Diagnosis not present

## 2017-03-13 DIAGNOSIS — D649 Anemia, unspecified: Secondary | ICD-10-CM | POA: Insufficient documentation

## 2017-03-13 DIAGNOSIS — Z720 Tobacco use: Secondary | ICD-10-CM

## 2017-03-13 DIAGNOSIS — E782 Mixed hyperlipidemia: Secondary | ICD-10-CM | POA: Diagnosis not present

## 2017-03-13 HISTORY — DX: Anemia, unspecified: D64.9

## 2017-03-13 HISTORY — DX: Testicular hypofunction: E29.1

## 2017-03-13 NOTE — Patient Instructions (Signed)
Tobacco Cessation:   [id number] or [phone], the former for support and possibly free nicotine patches/gum and support; the latter for [facility] Smoking cessation class. Get rid of all smoking supplies:  Cigarettes, lighters, ashtrays--no stashes just in case at home if you are serious.  For Nicotine gum:  When you feel need for smoking:  Place nicotine gum in mouth and chew until juicy, then stick between gum and cheek.  You will absorb the nicotine from your mouth.   Do not aggressively chew gum. Spit gum out in garbage once you feel you are done with it.

## 2017-03-13 NOTE — Progress Notes (Signed)
Subjective:    Patient ID: Neil [name], male    DOB: [birth year], 56 y.o.   MRN: [id number]  HPI   1.   Tobacco Use:  Down to 2 cigarettes daily, though quit back in June, so has increased since then.  Thinks he stopped completely for 1-2 months.   Used the patches for about 1 month after the surgery for pituitary macroadenoma.     2.  Hyperlipidemia:  Feels he is walking more.  Describes a fatty diet.  Has not really made significant changes.  3.  Mild anemia:  Has recurred.  Iron studies previously did not support iron deficiency.  Stool cards were negative for blood.  MCV was in normal range.  4.  Low Testosterone:  Has a follow up to address low testosterone with Dr. Dwyane Dee.  States he has been using a topical gel.  States he stopped using 2 days ago as he ran out.  Discussed has refills.  He just started this 28th of August.  Not clear he had improvement of mild fatigue with this.  5.  Pituitary issues:  Continues on Cortef, thyroid replacement  following pituitary macroadenoma removal in June.  Has follow up with Dr. Arnoldo Morale in November and Dr. Dwyane Dee in a couple of days. Free T4 end of August was in therapeutic range. Testosterone issues as above.  Not living in [place]--has moved to [place].   Current Meds  Medication Sig  . acetaminophen (TYLENOL) 500 MG tablet Take 250 mg by mouth daily as needed for moderate pain or headache.  Marland Kitchen HYDROcodone-acetaminophen (NORCO/VICODIN) 5-325 MG tablet Take 1 tablet by mouth every 4 (four) hours as needed for moderate pain.  . hydrocortisone (CORTEF) 10 MG tablet Take 1 tablet (10 mg total) by mouth daily.  . hydrocortisone cream 1 % Apply to affected area 2 times daily for 7 days.  Marland Kitchen levothyroxine (SYNTHROID, LEVOTHROID) 50 MCG tablet Take 1 tablet (50 mcg total) by mouth daily.  . Naphazoline HCl (CLEAR EYES OP) Apply 1 drop to eye daily as needed (dry eyes).    Allergies  Allergen  Reactions  . No Known Allergies       Review of Systems     Objective:   Physical Exam  NAD HEENT:  PERRL, EOMI Neck:  Supple, NO adenopathy, no thyromegaly Chest:  CTA CV:  RRR with normal S1 and S2, No S3, S4 or murmur.  Radial and DP pulses normal and equal Abd: S, NT, No HSM or mass. LE:  No edema.        Assessment & Plan:  1.  Tobacco use:  Encouraged to try nicotine gum to stop smoking.  Went over how to use. Seems motivated  2.  Hyperlipidemia:  Has not really made changes.  Is now on Testosterone replacement and will need to have cholesterol followed for that.  He will not be following up here as has moved out of [facility] and out of the neighborhood.  Recommend working on diet and physical activity along with testosterone replacement and following up with [facility] in 3-4 months.  3.  Mild anemia:  Likely due to low testosterone, but will check vitamin B12 and RBC folate.    4.  Low testosterone:  Discussed he needs to refill testosterone gel and continue unless he is told otherwise by Dr. Dwyane Dee.  5.  Hypothyroidism:  Appears to be adequately replaced.  Again, as he is no longer in  our neighborhood and is insured through [place], have discussed he needs to find primary care elsewhere--gave him information regarding [facility].  There is also another low cost clinic in [place].

## 2017-03-14 LAB — SPECIMEN STATUS REPORT

## 2017-03-15 ENCOUNTER — Other Ambulatory Visit: Payer: Medicaid Other

## 2017-03-15 LAB — VITAMIN B12: VITAMIN B 12: 715 pg/mL (ref 232–1245)

## 2017-03-15 LAB — FOLATE RBC: HEMATOCRIT: 36.1 % — AB (ref 37.5–51.0)

## 2017-03-15 LAB — SPECIMEN STATUS REPORT

## 2017-03-18 ENCOUNTER — Emergency Department (HOSPITAL_COMMUNITY)
Admission: EM | Admit: 2017-03-18 | Discharge: 2017-03-18 | Disposition: A | Discharge status: Home / Self Care | Payer: Medicaid Other | Attending: Emergency Medicine | Admitting: Emergency Medicine

## 2017-03-18 ENCOUNTER — Encounter (HOSPITAL_COMMUNITY): Payer: Self-pay | Admitting: *Deleted

## 2017-03-18 DIAGNOSIS — Y998 Other external cause status: Secondary | ICD-10-CM | POA: Insufficient documentation

## 2017-03-18 DIAGNOSIS — E039 Hypothyroidism, unspecified: Secondary | ICD-10-CM | POA: Insufficient documentation

## 2017-03-18 DIAGNOSIS — Y929 Unspecified place or not applicable: Secondary | ICD-10-CM | POA: Diagnosis not present

## 2017-03-18 DIAGNOSIS — Z79899 Other long term (current) drug therapy: Secondary | ICD-10-CM | POA: Insufficient documentation

## 2017-03-18 DIAGNOSIS — W60XXXA Contact with nonvenomous plant thorns and spines and sharp leaves, initial encounter: Secondary | ICD-10-CM | POA: Diagnosis not present

## 2017-03-18 DIAGNOSIS — Y9389 Activity, other specified: Secondary | ICD-10-CM | POA: Insufficient documentation

## 2017-03-18 DIAGNOSIS — S30850A Superficial foreign body of lower back and pelvis, initial encounter: Secondary | ICD-10-CM

## 2017-03-18 DIAGNOSIS — F17210 Nicotine dependence, cigarettes, uncomplicated: Secondary | ICD-10-CM | POA: Diagnosis not present

## 2017-03-18 NOTE — Discharge Instructions (Signed)
Take ibuprofen or tylenol for pain  Return for worsening symptoms, including fever, increased redness/swelling, escalating pain or any other symptoms concerning ot you.

## 2017-03-18 NOTE — ED Provider Notes (Signed)
[facility] DEPT Provider Note   CSN: [id number] Arrival date & time: 03/18/17  0256     History   Chief Complaint Chief Complaint  Patient presents with  . Rash    HPI Neil [name] is a 56 y.o. male.  The history is provided by the patient.  Illness  This is a new problem. The current episode started yesterday. The problem occurs rarely. The problem has not changed since onset.Pertinent negatives include no chest pain, no abdominal pain and no shortness of breath. Exacerbated by: sitting movement. Nothing relieves the symptoms. He has tried nothing for the symptoms.   56 year old male who presents with splinter in buttock. He was washing car yesterday, bent over and accidentally sat on a garden cactus. With cactus splinters to the right buttock. He attempted to remove some of the splinters himself, but unable to remove them all. Denies fall or injury, fevers or chills. Reports right buttock pain. No increased swelling or redness. Past Medical History:  Diagnosis Date  . Anemia 03/13/2017  . Anxiety   . Bipolar disorder [facility]) age 82 yo  . Chest pain   . Depression   . Dyslipidemia, goal LDL below 100   . Headache   . Hypogonadism in male 03/13/2017  . Hypothyroidism   . Schizophrenia [facility]) age 33 yo  . 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  . Tobacco abuse     Patient Active Problem List   Diagnosis Date Noted  . Hypogonadism in male 03/13/2017  . Anemia 03/13/2017  . 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 Pituitary macroadenoma ([place]) 08/21/2016  . Tobacco abuse   . Dyslipidemia, goal LDL below 100   . Sebaceous cyst of left axilla 06/08/2015  . Boil of trunk 06/08/2015  . Healthcare maintenance 06/08/2015  . Lumbar radiculopathy 03/16/2015  . Urinary hesitancy 03/16/2015  .  Hyperlipidemia 03/05/2015  . Chest pain 03/05/2015  . Depression 11/02/2014  . Schizophrenia ([place]) 11/02/2014    Past Surgical History:  Procedure Laterality Date  . CRANIOTOMY N/A 12/07/2016   Procedure: TRANSPHENOIDAL RESECTION OF TUMOR;  Surgeon: Newman Pies, MD;  Location: [place];  Service: Neurosurgery;  Laterality: N/A;  TRANSPHENOIDAL RESECTION OF TUMOR  . HEMORROIDECTOMY  2007  . TRANSNASAL APPROACH N/A 12/07/2016   Procedure: TRANSNASAL APPROACH;  Surgeon: Rozetta Nunnery, MD;  Location: [place];  Service: ENT;  Laterality: N/A;       Home Medications    Prior to Admission medications   Medication Sig Start Date End Date Taking? Authorizing Provider  acetaminophen (TYLENOL) 500 MG tablet Take 250 mg by mouth daily as needed for moderate pain or headache.    [provider]  diphenhydrAMINE (BENADRYL) 25 mg capsule Take 1 capsule (25 mg total) by mouth every 6 (six) hours as needed for itching. Patient not taking: Reported on 03/13/2017 01/12/17   Long, Wonda Olds, MD  HYDROcodone-acetaminophen (NORCO/VICODIN) 5-325 MG tablet Take 1 tablet by mouth every 4 (four) hours as needed for moderate pain. 12/09/16   Costella, Vista Mink, PA-C  hydrocortisone (CORTEF) 10 MG tablet Take 1 tablet (10 mg total) by mouth daily. 12/09/16   Costella, Vista Mink, PA-C  hydrocortisone cream 1 % Apply to affected area 2 times daily for 7 days. 01/12/17   Long, Wonda Olds, MD  levothyroxine (SYNTHROID, LEVOTHROID) 50  MCG tablet Take 1 tablet (50 mcg total) by mouth daily. 02/06/17   Elayne Snare, MD  Naphazoline HCl (CLEAR EYES OP) Apply 1 drop to eye daily as needed (dry eyes).    [provider]  testosterone (ANDROGEL) 50 MG/5GM (1%) GEL Place 5 g onto the skin daily. Patient not taking: Reported on 03/13/2017 02/06/17   Elayne Snare, MD    Family History Family History  Problem Relation Age of Onset  . Hypertension Mother   . Stroke Mother   . Mental retardation Mother   .  Heart failure Father   . Hypertension Sister   . Bipolar disorder Son   . Schizophrenia Son     Social History Social History  Substance Use Topics  . Smoking status: Current Every Day Smoker    Packs/day: 0.10    Years: 40.00    Types: Cigarettes  . Smokeless tobacco: Never Used     Comment: Quit in June and then back to 2 cigarettes daily.    . Alcohol use No     Allergies   No known allergies   Review of Systems Review of Systems  Constitutional: Negative for fever.  Respiratory: Negative for shortness of breath.   Cardiovascular: Negative for chest pain.  Gastrointestinal: Negative for abdominal pain.  Skin: Negative for wound.       Splinter to skin   Allergic/Immunologic: Negative for immunocompromised state.  Hematological: Does not bruise/bleed easily.     Physical Exam Updated Vital Signs BP 128/82 (BP Location: Right Arm)   Pulse 62   Temp (!) 97.4 F (36.3 C) (Oral)   Resp 18   Ht 5\' 8"  (1.727 m)   Wt 67.1 kg (148 lb)   SpO2 100%   BMI 22.50 kg/m   Physical Exam Physical Exam  Constitutional: Appears well-developed and well-nourished. No acute distress. HENT:  Head: Normocephalic.  Eyes: Conjunctivae are normal.  Neck: supple Cardiovascular: Normal rate and intact distal pulses.   Pulmonary/Chest: Effort normal. No respiratory distress.  Abdominal: Exhibits no distension.  Musculoskeletal: Normal range of motion. Exhibits no deformity.  Neurological: Alert. Fluent speech.  Skin: Skin is warm and dry. 2 palpable splinters in the right buttock. No overlying skin changes Psychiatric: Normal mood and affect. Behavior is normal.  Nursing note and vitals reviewed.   ED Treatments / Results  Labs (all labs ordered are listed, but only abnormal results are displayed) Labs Reviewed - No data to display  EKG  EKG Interpretation None       Radiology No results found.  Procedures .Foreign Body Removal Date/Time: 03/18/2017 7:22  AM Performed by: Brantley Stage DUO Authorized by: Brantley Stage DUO  Consent: Verbal consent obtained. Risks and benefits: risks, benefits and alternatives were discussed Consent given by: patient Patient understanding: patient states understanding of the procedure being performed Patient identity confirmed: verbally with patient Intake: right buttock.  Sedation: Patient sedated: no Patient restrained: no Patient cooperative: yes Complexity: simple 2 objects recovered. Objects recovered: splinters Post-procedure assessment: foreign body removed Patient tolerance: Patient tolerated the procedure well with no immediate complications   (including critical care time)  Medications Ordered in ED Medications - No data to display   Initial Impression / Assessment and Plan / ED Course  I have reviewed the triage vital signs and the nursing notes.  Pertinent labs & imaging results that were available during my care of the patient were reviewed by me and considered in my medical decision making (see chart for  details).    2 splinters removed at bedside. No other palpable foreign bodies. No overlying skin changes. Supportive care instructions reviewed for home. Strict return and follow-up instructions reviewed. He expressed understanding of all discharge instructions and felt comfortable with the plan of care.   Final Clinical Impressions(s) / ED Diagnoses   Final diagnoses:  Splinter of buttock without major open wound, initial encounter    New Prescriptions New Prescriptions   No medications on file     Forde Dandy, MD 03/18/17 [phone]

## 2017-03-18 NOTE — ED Triage Notes (Signed)
Pt reports he sat on a cactus yesterday and still has the spines in his right buttocks.

## 2017-03-20 ENCOUNTER — Ambulatory Visit: Payer: Medicaid Other | Admitting: Endocrinology

## 2017-04-10 ENCOUNTER — Other Ambulatory Visit: Payer: Medicaid Other | Admitting: Internal Medicine

## 2017-04-10 DIAGNOSIS — D649 Anemia, unspecified: Secondary | ICD-10-CM

## 2017-04-11 LAB — FOLATE RBC
FOLATE, HEMOLYSATE: 314.8 ng/mL
FOLATE, RBC: 899 ng/mL (ref >498)
Hematocrit: 35 % — ABNORMAL LOW (ref 37.5–51.0)

## 2017-05-11 ENCOUNTER — Telehealth: Payer: Self-pay

## 2017-05-11 NOTE — Telephone Encounter (Signed)
Patient is doing a clinical trial through [employer] and needs some things faxed in a form was  Faxed to [place] yesterday

## 2017-05-14 NOTE — Telephone Encounter (Signed)
Please see message and advise 

## 2017-05-14 NOTE — Telephone Encounter (Signed)
Has anyone seen these forms?

## 2017-05-14 NOTE — Telephone Encounter (Signed)
I have not seen any forms.  Need to know if he is coming back for follow-up otherwise we may need to dismiss him

## 2017-05-15 ENCOUNTER — Telehealth: Payer: Self-pay | Admitting: Endocrinology

## 2017-05-15 NOTE — Telephone Encounter (Signed)
LM for pt to call back to schedule an appt.  °

## 2017-05-15 NOTE — Telephone Encounter (Signed)
error 

## 2017-05-24 ENCOUNTER — Telehealth: Payer: Self-pay

## 2017-05-24 NOTE — Telephone Encounter (Signed)
He has an appointment on the 18th but he needs to come in before for his labs

## 2017-05-24 NOTE — Telephone Encounter (Signed)
Called patient to schedule someone answered but hung up- patient has not scheduled a f/u please advise

## 2017-05-29 ENCOUNTER — Encounter: Payer: Self-pay | Admitting: Endocrinology

## 2017-05-29 ENCOUNTER — Ambulatory Visit (INDEPENDENT_AMBULATORY_CARE_PROVIDER_SITE_OTHER): Payer: Medicaid Other | Admitting: Endocrinology

## 2017-05-29 VITALS — BP 128/78 | HR 79 | Ht 68.0 in | Wt 158.0 lb

## 2017-05-29 DIAGNOSIS — E230 Hypopituitarism: Secondary | ICD-10-CM
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
## 2017-06-13 ENCOUNTER — Other Ambulatory Visit: Payer: Medicaid Other

## 2017-07-16 ENCOUNTER — Telehealth: Payer: Self-pay | Admitting: Endocrinology

## 2017-07-16 MED ORDER — TESTOSTERONE 50 MG/5GM (1%) TD GEL: 5.0000 g | Freq: Every day | TRANSDERMAL | 3 refills | Status: DC

## 2017-07-16 NOTE — Telephone Encounter (Signed)
This has been filled for the patient and sent to the pharmacy requested.

## 2017-07-16 NOTE — Telephone Encounter (Signed)
testosterone (ANDROGEL) 50 MG/5GM (1%) GEL  [id number] - [place], [place] - [address]

## 2017-07-17 ENCOUNTER — Other Ambulatory Visit: Payer: Self-pay | Admitting: Endocrinology

## 2017-07-17 MED ORDER — TESTOSTERONE 50 MG/5GM (1%) TD GEL: 5.0000 g | Freq: Every day | TRANSDERMAL | 3 refills | Status: DC

## 2017-07-19 ENCOUNTER — Telehealth: Payer: Self-pay | Admitting: Endocrinology

## 2017-07-19 NOTE — Telephone Encounter (Signed)
Patient need refill of Androgel  [id number] - [place], [place] - [address] AT [facility] [phone] (Phone) [phone] (Fax)

## 2017-07-26 ENCOUNTER — Telehealth: Payer: Self-pay

## 2017-07-26 MED ORDER — TESTOSTERONE 20.25 MG/ACT (1.62%) TD GEL: TRANSDERMAL | 0 refills | Status: DC

## 2017-07-26 NOTE — Telephone Encounter (Signed)
Received PA for Androgel, spoke with [employer] and they stated that the gel pump will go through with no PA needed. Call tracking number was [id number]. Called pharmacy they are aware and stated they can only get the 1.62% GEL PUMP to go through. Is that okay to switch? Pharmacy will need a new RX if so.

## 2017-07-26 NOTE — Telephone Encounter (Signed)
Please print prescription for gel 1.62%, 1 pump on each arm

## 2017-07-26 NOTE — Addendum Note (Signed)
Addended by: Drucilla Schmidt on: 07/26/2017 03:10 PM   Modules accepted: Orders

## 2017-07-26 NOTE — Telephone Encounter (Signed)
Sent!

## 2017-07-30 ENCOUNTER — Emergency Department (HOSPITAL_COMMUNITY)
Admission: EM | Admit: 2017-07-30 | Discharge: 2017-07-31 | Disposition: A | Discharge status: Home / Self Care | Payer: Medicaid Other | Attending: Emergency Medicine | Admitting: Emergency Medicine

## 2017-07-30 ENCOUNTER — Emergency Department (HOSPITAL_COMMUNITY): Payer: Medicaid Other

## 2017-07-30 ENCOUNTER — Encounter (HOSPITAL_COMMUNITY): Payer: Self-pay

## 2017-07-30 DIAGNOSIS — S6982XA Other specified injuries of left wrist, hand and finger(s), initial encounter: Secondary | ICD-10-CM | POA: Diagnosis present

## 2017-07-30 DIAGNOSIS — Y9389 Activity, other specified: Secondary | ICD-10-CM | POA: Insufficient documentation

## 2017-07-30 DIAGNOSIS — S62357A Nondisplaced fracture of shaft of fifth metacarpal bone, left hand, initial encounter for closed fracture: Secondary | ICD-10-CM

## 2017-07-30 DIAGNOSIS — M79644 Pain in right finger(s): Secondary | ICD-10-CM | POA: Diagnosis not present

## 2017-07-30 DIAGNOSIS — Y999 Unspecified external cause status: Secondary | ICD-10-CM | POA: Insufficient documentation

## 2017-07-30 DIAGNOSIS — M79632 Pain in left forearm: Secondary | ICD-10-CM | POA: Diagnosis not present

## 2017-07-30 DIAGNOSIS — M25561 Pain in right knee: Secondary | ICD-10-CM | POA: Insufficient documentation

## 2017-07-30 DIAGNOSIS — Y92414 Local residential or business street as the place of occurrence of the external cause: Secondary | ICD-10-CM | POA: Diagnosis not present

## 2017-07-30 MED ORDER — OXYCODONE-ACETAMINOPHEN 5-325 MG PO TABS
1.0000 | ORAL_TABLET | ORAL | Status: DC | PRN
  Administered 2017-07-30: 1 via ORAL
  Filled 2017-07-30: qty 1
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
## 2017-07-31 MED ORDER — TRAMADOL HCL 50 MG PO TABS: 50.0000 mg | ORAL_TABLET | Freq: Four times a day (QID) | ORAL | 0 refills | Status: DC | PRN

## 2017-07-31 MED ORDER — NAPROXEN 500 MG PO TABS: 500.0000 mg | ORAL_TABLET | Freq: Two times a day (BID) | ORAL | 0 refills | Status: DC

## 2017-07-31 NOTE — ED Notes (Signed)
Patient Alert and oriented X4. Stable and ambulatory. Patient verbalized understanding of the discharge instructions.  Patient belongings were taken by the patient.  

## 2017-07-31 NOTE — ED Notes (Signed)
Ortho paged for the ulnar gutter splint

## 2017-07-31 NOTE — Discharge Instructions (Signed)
Please read and follow all provided instructions.  Tests performed today include: X-rays of your right ring finger, right knee, left hand/wrist/forearm   You were found to have a fracture of your left 5th metacarpal bone and a possible fracture of your right 4th finger. Splints were applied to these areas and should remain in place until hand surgery follow up. The hand surgeon will call you tomorrow to schedule a follow up appointment.   Medications prescribed:    Naproxen is a nonsteroidal anti-inflammatory medication that will help with pain and swelling. Be sure to take this medication as prescribed with food, 1 pill every 12 hours,  It should be taken with food, as it can cause stomach upset, and more seriously, stomach bleeding. Do not take other nonsteroidal anti-inflammatory medications with this such as Advil, Motrin, or Aleve.   Tramadol- take every 6 hours as needed for severe pain, do not drive/operate heavy machinery when taking this medication.   Return instructions:  Please return to the Emergency Department if you experience worsening symptoms.  You have numbness, tingling, or weakness in the arms or legs.  You develop severe headaches not relieved with medicine.  You have severe neck pain, especially tenderness in the middle of the back of your neck.  You have vision or hearing changes If you develop confusion You have changes in bowel or bladder control.  There is increasing pain in any area of the body.  You have shortness of breath, lightheadedness, dizziness, or fainting.  You have chest pain.  You feel sick to your stomach (nauseous), or throw up (vomit).  You have increasing abdominal discomfort.  There is blood in your urine, stool, or vomit.  You have pain in your shoulder (shoulder strap areas).  You feel your symptoms are getting worse or if you have any other emergent concerns  Additional Information:  Your vital signs today were: Vitals:   07/30/17 1912    BP: (!) 131/92  Pulse: 88  Resp: 20  Temp: 98.3 F (36.8 C)  SpO2: 100%     If your blood pressure (BP) was elevated above 135/85 this visit, please have this repeated by your doctor within one week -----------------------------------------------------

## 2017-07-31 NOTE — Progress Notes (Signed)
Orthopedic Tech Progress Note Patient Details:  Neil [name] 05/01/1961 [id number]  Ortho Devices Type of Ortho Device: Arm sling, Ulna gutter splint, Finger splint Ortho Device/Splint Location: lue ulna gutter, rue  Ortho Device/Splint Interventions: Ordered, Application, Adjustment   Post Interventions Patient Tolerated: Well Instructions Provided: Care of device, Adjustment of device   Karolee Stamps 07/31/2017, 1:01 AM

## 2017-08-15 ENCOUNTER — Telehealth: Payer: Self-pay | Admitting: Internal Medicine

## 2017-08-15 NOTE — Telephone Encounter (Signed)
Neil [name] at [facility] called stating  has some question regarding medical notes for patient Neil, [name]  on 03/13/2017. (Again, as he is no longer in our neighborhood and is insured through [place], have discussed he needs to find primary care elsewhere--gave him information regarding [facility].  There is also another low cost clinic in [place].).  Neil [name] can be reach at [phone] or by email at [email or web address]   Cherice please advise

## 2017-08-15 NOTE — Telephone Encounter (Signed)
To Dr. Mulberry for further direction 

## 2017-08-16 ENCOUNTER — Other Ambulatory Visit: Payer: Self-pay | Admitting: Endocrinology

## 2017-08-16 NOTE — Telephone Encounter (Signed)
I do not have the information about these places --I do know they are there.  They would need to google them.  One free clinic is supported by [facility].

## 2017-08-16 NOTE — Telephone Encounter (Signed)
Spoke with chris informed we do not have that information regarding the clinics in [employer]. Per Dr. Amil Amen you have to google to get that information

## 2017-08-19 IMAGING — DX DG CHEST 2V
2 series · 2 of 2 positions shown · non-contrast
Comparison: 11/02/2015

CLINICAL DATA: Night sweats and hypoxemia

EXAM:
CHEST  2 VIEW

[chest pa]
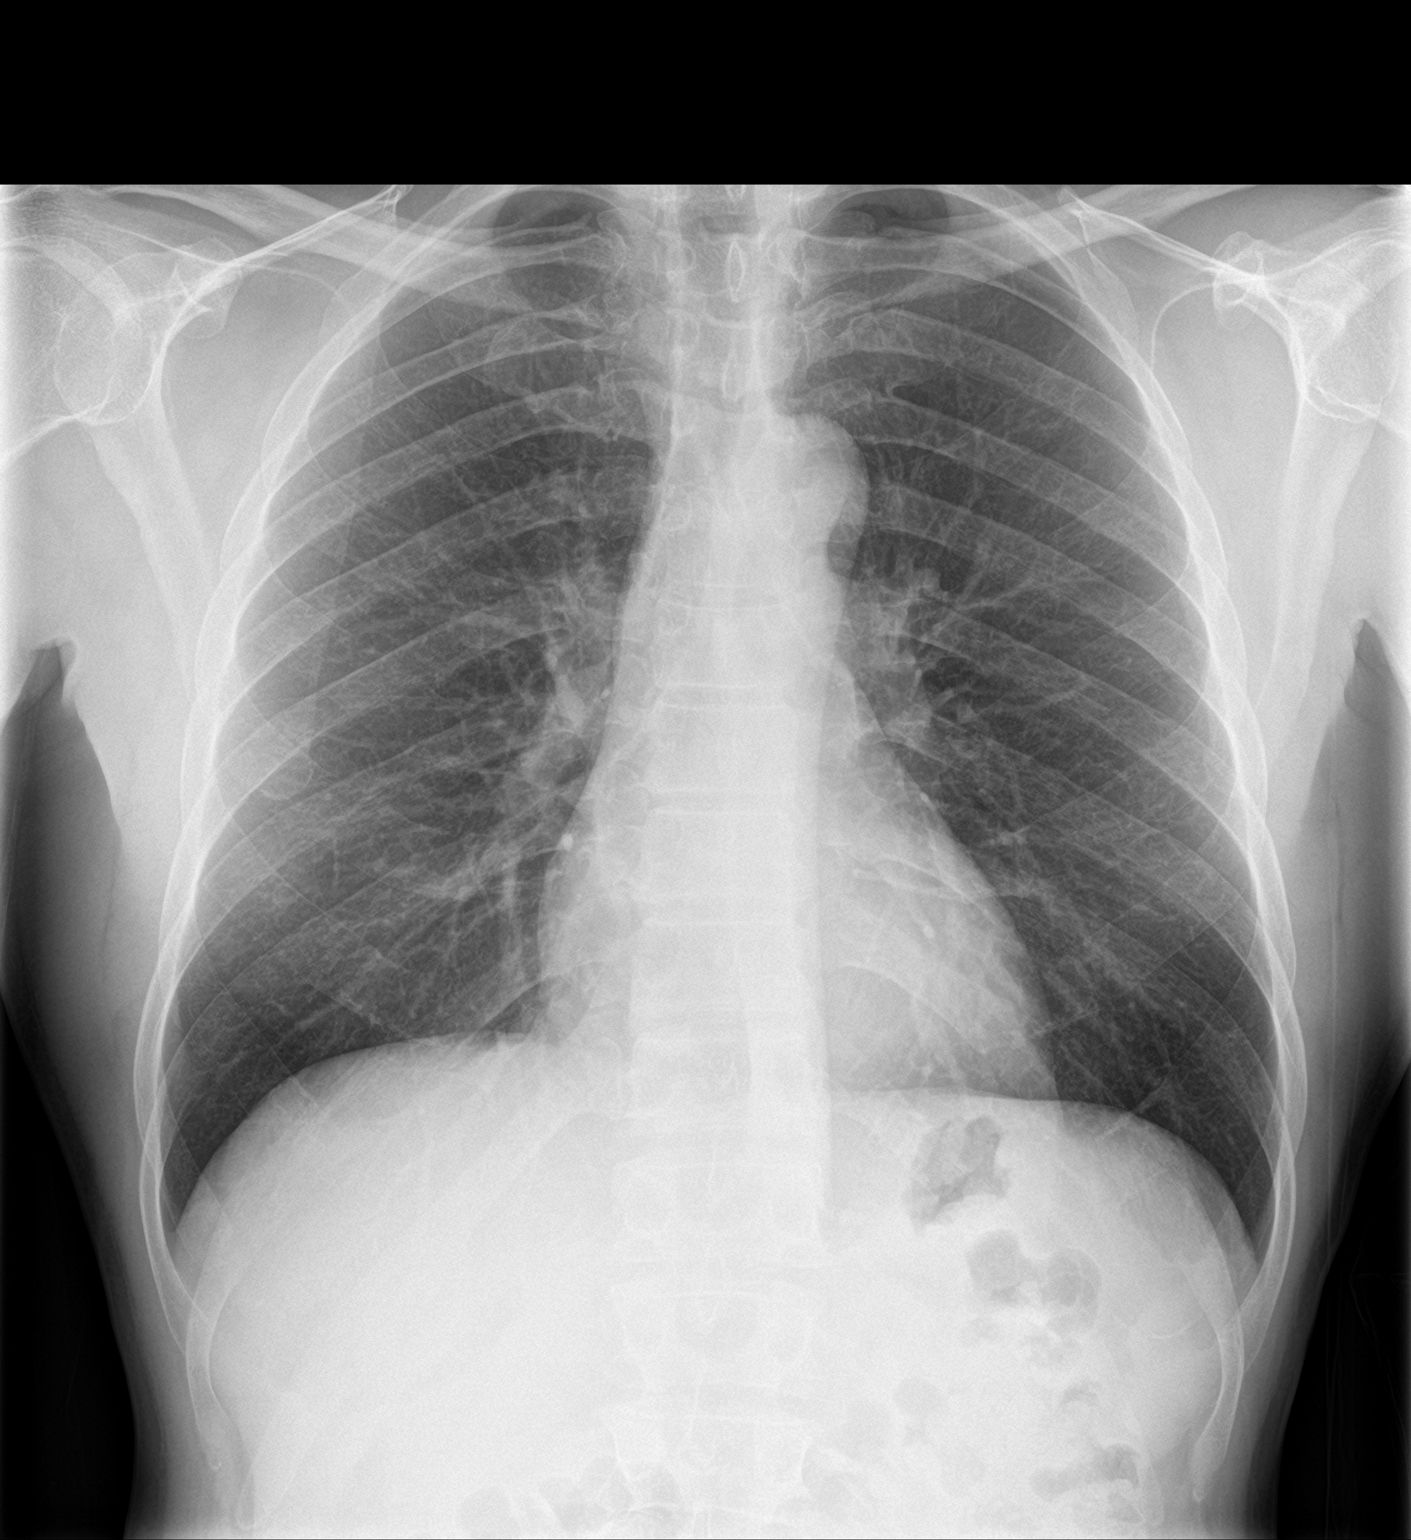

[chest lat]
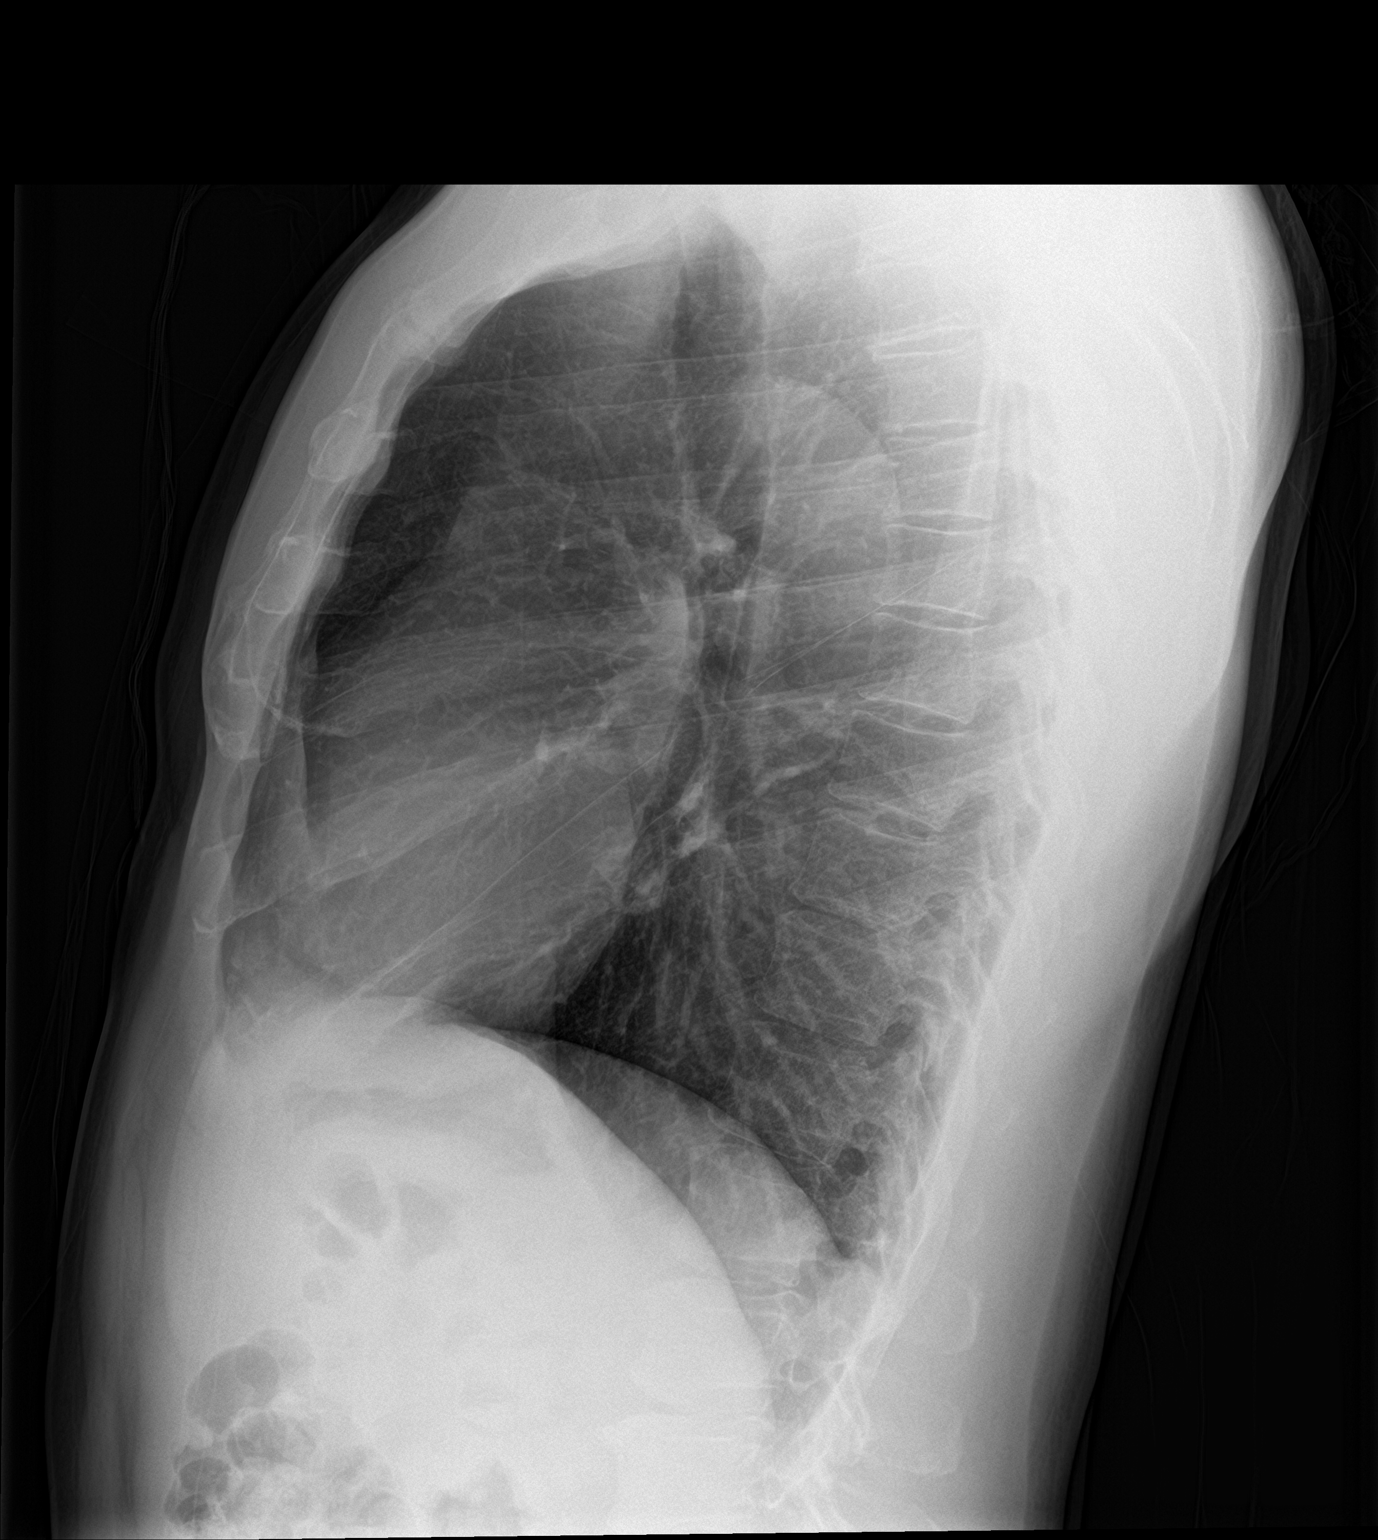

[2 of 2 positions shown; findings below may reference images not displayed]

FINDINGS: Normal heart size. Lungs clear. No pneumothorax. No pleural
effusion.
IMPRESSION: No active cardiopulmonary disease.

## 2017-08-20 ENCOUNTER — Other Ambulatory Visit: Payer: Self-pay | Admitting: Endocrinology

## 2017-08-20 ENCOUNTER — Telehealth: Payer: Self-pay | Admitting: Endocrinology

## 2017-08-20 MED ORDER — TESTOSTERONE 50 MG/5GM (1%) TD GEL: 5.0000 g | Freq: Every day | TRANSDERMAL | 2 refills | Status: DC

## 2017-08-20 NOTE — Telephone Encounter (Signed)
Need refill of ANDROGEL 50 MG/5GM (1%) GEL [[id number]]  Send to  [id number] - [place], [place] - [place] DEA #:  [id number]

## 2017-08-20 NOTE — Telephone Encounter (Signed)
Prescription has been sent.

## 2017-08-24 ENCOUNTER — Other Ambulatory Visit: Payer: Medicaid Other

## 2017-08-27 ENCOUNTER — Ambulatory Visit: Payer: Medicaid Other | Admitting: Endocrinology

## 2017-10-10 ENCOUNTER — Other Ambulatory Visit (INDEPENDENT_AMBULATORY_CARE_PROVIDER_SITE_OTHER): Payer: Medicaid Other

## 2017-10-10 ENCOUNTER — Encounter: Payer: Medicaid Other | Admitting: Endocrinology

## 2017-10-10 ENCOUNTER — Encounter: Payer: Self-pay | Admitting: Endocrinology

## 2017-10-10 DIAGNOSIS — E230 Hypopituitarism: Secondary | ICD-10-CM

## 2017-10-10 LAB — T4, FREE: Free T4: 0.5 ng/dL — ABNORMAL LOW (ref 0.60–1.60)

## 2017-10-10 LAB — BASIC METABOLIC PANEL
BUN: 16 mg/dL (ref 6–23)
CO2: 29 meq/L (ref 19–32)
Calcium: 10.1 mg/dL (ref 8.4–10.5)
Chloride: 104 mEq/L (ref 96–112)
Creatinine, Ser: 1.32 mg/dL (ref 0.40–1.50)
GFR: 71.81 mL/min (ref >60.00)
GLUCOSE: 88 mg/dL (ref 70–99)
POTASSIUM: 4.1 meq/L (ref 3.5–5.1)
SODIUM: 139 meq/L (ref 135–145)

## 2017-10-10 LAB — TESTOSTERONE: Testosterone: 19.91 ng/dL — ABNORMAL LOW (ref 300.00–890.00)

## 2017-10-11 NOTE — Progress Notes (Signed)
This encounter was created in error - please disregard.

## 2017-10-23 ENCOUNTER — Ambulatory Visit (INDEPENDENT_AMBULATORY_CARE_PROVIDER_SITE_OTHER): Payer: Medicaid Other | Admitting: Endocrinology

## 2017-10-23 ENCOUNTER — Encounter: Payer: Self-pay | Admitting: Endocrinology

## 2017-10-23 VITALS — BP 109/84 | HR 91 | Ht 68.0 in | Wt 156.6 lb

## 2017-10-23 DIAGNOSIS — E230 Hypopituitarism: Secondary | ICD-10-CM

## 2017-10-23 MED ORDER — HYDROCORTISONE 10 MG PO TABS: 10.0000 mg | ORAL_TABLET | Freq: Every day | ORAL | 3 refills | Status: DC

## 2017-10-23 MED ORDER — LEVOTHYROXINE SODIUM 75 MCG PO TABS: 75.0000 ug | ORAL_TABLET | Freq: Every day | ORAL | 2 refills | Status: DC

## 2017-10-23 NOTE — Patient Instructions (Signed)
Take full pack of Androgel
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
## 2017-11-26 ENCOUNTER — Encounter: Payer: Self-pay | Admitting: Internal Medicine

## 2017-12-21 ENCOUNTER — Other Ambulatory Visit (INDEPENDENT_AMBULATORY_CARE_PROVIDER_SITE_OTHER): Payer: Medicaid Other

## 2017-12-21 DIAGNOSIS — E230 Hypopituitarism: Secondary | ICD-10-CM | POA: Diagnosis not present

## 2017-12-21 LAB — T4, FREE: Free T4: 0.51 ng/dL — ABNORMAL LOW (ref 0.60–1.60)

## 2017-12-21 LAB — TESTOSTERONE: Testosterone: 41.62 ng/dL — ABNORMAL LOW (ref 300.00–890.00)

## 2017-12-21 LAB — BASIC METABOLIC PANEL
BUN: 20 mg/dL (ref 6–23)
CO2: 25 meq/L (ref 19–32)
Calcium: 9.6 mg/dL (ref 8.4–10.5)
Chloride: 107 mEq/L (ref 96–112)
Creatinine, Ser: 1.26 mg/dL (ref 0.40–1.50)
GFR: 75.71 mL/min (ref >60.00)
GLUCOSE: 94 mg/dL (ref 70–99)
POTASSIUM: 4.1 meq/L (ref 3.5–5.1)
Sodium: 139 mEq/L (ref 135–145)

## 2017-12-24 ENCOUNTER — Ambulatory Visit: Payer: Medicaid Other | Admitting: Endocrinology

## 2017-12-25 ENCOUNTER — Ambulatory Visit (INDEPENDENT_AMBULATORY_CARE_PROVIDER_SITE_OTHER): Payer: Medicaid Other | Admitting: Endocrinology

## 2017-12-25 ENCOUNTER — Encounter: Payer: Self-pay | Admitting: Endocrinology

## 2017-12-25 VITALS — BP 122/79 | HR 69 | Ht 68.0 in | Wt 154.8 lb

## 2017-12-25 DIAGNOSIS — D497 Neoplasm of unspecified behavior of endocrine glands and other parts of nervous system: Secondary | ICD-10-CM

## 2017-12-25 DIAGNOSIS — E230 Hypopituitarism: Secondary | ICD-10-CM

## 2017-12-25 MED ORDER — LEVOTHYROXINE SODIUM 112 MCG PO TABS
112.0000 ug | ORAL_TABLET | Freq: Every day | ORAL | 1 refills | Status: DC
Start: 2017-12-25 — End: 2018-04-11

## 2017-12-25 MED ORDER — TESTOSTERONE 50 MG/5GM (1%) TD GEL: 7.5000 g | Freq: Every day | TRANSDERMAL | 2 refills | Status: DC

## 2017-12-25 MED ORDER — HYDROCORTISONE 10 MG PO TABS
10.0000 mg | ORAL_TABLET | Freq: Every day | ORAL | 3 refills | Status: DC
Start: 2017-12-25 — End: 2018-04-23
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
## 2018-01-16 ENCOUNTER — Emergency Department (HOSPITAL_COMMUNITY)
Admission: EM | Admit: 2018-01-16 | Discharge: 2018-01-16 | Disposition: A | Discharge status: Home / Self Care | Payer: Medicaid Other | Attending: Emergency Medicine | Admitting: Emergency Medicine

## 2018-01-16 ENCOUNTER — Encounter (HOSPITAL_COMMUNITY): Payer: Self-pay | Admitting: *Deleted

## 2018-01-16 ENCOUNTER — Other Ambulatory Visit: Payer: Self-pay

## 2018-01-16 DIAGNOSIS — Z79899 Other long term (current) drug therapy: Secondary | ICD-10-CM | POA: Insufficient documentation

## 2018-01-16 DIAGNOSIS — L02213 Cutaneous abscess of chest wall: Secondary | ICD-10-CM | POA: Diagnosis present

## 2018-01-16 DIAGNOSIS — F17210 Nicotine dependence, cigarettes, uncomplicated: Secondary | ICD-10-CM | POA: Insufficient documentation

## 2018-01-16 DIAGNOSIS — L0291 Cutaneous abscess, unspecified: Secondary | ICD-10-CM

## 2018-01-16 MED ORDER — IBUPROFEN 800 MG PO TABS: 800.0000 mg | ORAL_TABLET | Freq: Three times a day (TID) | ORAL | 0 refills | Status: DC

## 2018-01-16 MED ORDER — POVIDONE-IODINE 10 % EX SOLN
CUTANEOUS | Status: DC | PRN
  Administered 2018-01-16: 22:00:00 via TOPICAL
  Filled 2018-01-16: qty 15

## 2018-01-16 MED ORDER — SULFAMETHOXAZOLE-TRIMETHOPRIM 800-160 MG PO TABS
1.0000 | ORAL_TABLET | Freq: Once | ORAL | Status: AC
  Administered 2018-01-16: 1 via ORAL
  Filled 2018-01-16: qty 1

## 2018-01-16 MED ORDER — SULFAMETHOXAZOLE-TRIMETHOPRIM 800-160 MG PO TABS: 1.0000 | ORAL_TABLET | Freq: Two times a day (BID) | ORAL | 0 refills | Status: AC

## 2018-01-16 MED ORDER — LIDOCAINE HCL (PF) 2 % IJ SOLN
10.0000 mL | Freq: Once | INTRAMUSCULAR | Status: AC
  Administered 2018-01-16: 10 mL

## 2018-01-16 MED ORDER — LIDOCAINE HCL (PF) 2 % IJ SOLN
INTRAMUSCULAR | Status: AC
  Filled 2018-01-16: qty 20

## 2018-01-16 NOTE — ED Triage Notes (Signed)
Pt with insect bite, noted 2 weeks ago under left axillary.  Pt denies fever.  Pt denies any drainage.

## 2018-01-16 NOTE — Discharge Instructions (Addendum)
Apply warm water soaks twice daily as discussed for 5-10 minutes.  Keep covered until it is no longer draining.  Take the entire course of the antibiotics prescribed.

## 2018-01-17 NOTE — ED Provider Notes (Signed)
[facility] EMERGENCY DEPARTMENT Provider Note   CSN: [id number] Arrival date & time: 01/16/18  2056     History   Chief Complaint Chief Complaint  Patient presents with  . Insect Bite    HPI Neil [name] is a 57 y.o. male.  The history is provided by the patient and the spouse.  Abscess  Location:  Torso Torso abscess location:  L chest Abscess quality: induration, painful and redness   Red streaking: no   Duration:  2 weeks Progression:  Worsening Pain details:    Quality:  Pressure and sharp   Severity:  Moderate   Duration:  3 days   Progression:  Worsening Chronicity:  Recurrent (has had abscess in the past, not at this location.) Context: not diabetes and not immunosuppression   Relieved by:  Nothing Worsened by:  Draining/squeezing Ineffective treatments:  Warm compresses Associated symptoms: no fever, no nausea and no vomiting   Risk factors: prior abscess   Risk factors: no family hx of MRSA and no hx of MRSA     Past Medical History:  Diagnosis Date  . Anemia 03/13/2017  . Anxiety   . Bipolar disorder [facility]) age 43 yo  . Chest pain   . Depression   . Dyslipidemia, goal LDL below 100   . Headache   . Hypogonadism in male 03/13/2017  . Hypothyroidism   . Schizophrenia [facility]) age 5 yo  . 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  . Tobacco abuse     Patient Active Problem List   Diagnosis Date Noted  . Hypogonadism in male 03/13/2017  . Anemia 03/13/2017  . 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 Pituitary macroadenoma ([place]) 08/21/2016  . Tobacco abuse   . Dyslipidemia, goal LDL below 100   . Sebaceous cyst of left axilla 06/08/2015  . Boil of trunk 06/08/2015  . Healthcare maintenance 06/08/2015  . Lumbar radiculopathy 03/16/2015  . Urinary hesitancy 03/16/2015  .  Hyperlipidemia 03/05/2015  . Chest pain 03/05/2015  . Depression 11/02/2014  . Schizophrenia ([place]) 11/02/2014    Past Surgical History:  Procedure Laterality Date  . CRANIOTOMY N/A 12/07/2016   Procedure: TRANSPHENOIDAL RESECTION OF TUMOR;  Surgeon: Newman Pies, MD;  Location: [place];  Service: Neurosurgery;  Laterality: N/A;  TRANSPHENOIDAL RESECTION OF TUMOR  . HEMORROIDECTOMY  2007  . TRANSNASAL APPROACH N/A 12/07/2016   Procedure: TRANSNASAL APPROACH;  Surgeon: Rozetta Nunnery, MD;  Location: [place];  Service: ENT;  Laterality: N/A;        Home Medications    Prior to Admission medications   Medication Sig Start Date End Date Taking? Authorizing Provider  diphenhydrAMINE (BENADRYL) 25 mg capsule Take 1 capsule (25 mg total) by mouth every 6 (six) hours as needed for itching. 01/12/17   Long, Wonda Olds, MD  hydrocortisone (CORTEF) 10 MG tablet Take 1 tablet (10 mg total) by mouth daily. 12/25/17   Elayne Snare, MD  hydrocortisone cream 1 % Apply to affected area 2 times daily for 7 days. 01/12/17   Long, Wonda Olds, MD  ibuprofen (ADVIL,MOTRIN) 800 MG tablet Take 1 tablet (800 mg total) by mouth 3 (three) times daily. 01/16/18   Evalee Jefferson, PA-C  levothyroxine (SYNTHROID, LEVOTHROID) 112 MCG tablet Take 1 tablet (112 mcg total) by mouth daily. 12/25/17   Elayne Snare, MD  Naphazoline  HCl (CLEAR EYES OP) Apply 1 drop to eye daily as needed (dry eyes).    [provider]  sulfamethoxazole-trimethoprim (BACTRIM DS,SEPTRA DS) 800-160 MG tablet Take 1 tablet by mouth 2 (two) times daily for 10 days. 01/16/18 01/26/18  Evalee Jefferson, PA-C  testosterone (ANDROGEL) 50 MG/5GM (1%) GEL Place 7.5 g onto the skin daily. 12/25/17   Elayne Snare, MD    Family History Family History  Problem Relation Age of Onset  . Hypertension Mother   . Stroke Mother   . Mental retardation Mother   . Heart failure Father   . Hypertension Sister   . Bipolar disorder Son   . Schizophrenia Son     Social  History Social History   Tobacco Use  . Smoking status: Current Every Day Smoker    Packs/day: 0.10    Years: 40.00    Pack years: 4.00    Types: Cigarettes  . Smokeless tobacco: Never Used  . Tobacco comment: Quit in June and then back to 2 cigarettes daily.    Substance Use Topics  . Alcohol use: No    Alcohol/week: 0.0 standard drinks  . Drug use: Not Currently    Types: Marijuana    Comment: denies use 01/16/18     Allergies   No known allergies   Review of Systems Review of Systems  Constitutional: Negative for chills and fever.  Respiratory: Negative for shortness of breath and wheezing.   Gastrointestinal: Negative for nausea and vomiting.  Skin:       Negative except as mentioned in HPI.   Neurological: Negative for numbness.     Physical Exam Updated Vital Signs BP 120/74 (BP Location: Right Arm)   Pulse 72   Temp 98.1 F (36.7 C) (Oral)   Resp 20   Ht 5\' 8"  (1.727 m)   Wt 67.1 kg   SpO2 100%   BMI 22.50 kg/m   Physical Exam  Constitutional: He appears well-developed and well-nourished. No distress.  HENT:  Head: Normocephalic.  Cardiovascular: Normal rate.  Pulmonary/Chest: Effort normal.  Musculoskeletal: Normal range of motion. He exhibits no edema.  Skin:  3 cm round induration with central nondraining pustule left chest wall near mid axillary line. No drainage, no red streaking.      ED Treatments / Results  Labs (all labs ordered are listed, but only abnormal results are displayed) Labs Reviewed - No data to display  EKG None  Radiology No results found.  Procedures Procedures (including critical care time)  INCISION AND DRAINAGE Performed by: Evalee Jefferson Consent: Verbal consent obtained. Risks and benefits: risks, benefits and alternatives were discussed Type: abscess  Body area: left chest  Anesthesia: local infiltration  Incision was made with a scalpel.  Local anesthetic: lidocaine 1% without  epinephrine  Anesthetic total: 3 ml  Complexity: complex Blunt dissection to break up loculations  Drainage: purulent  Drainage amount: small amount purulent dc.  Packing material: none  Patient tolerance: Patient tolerated the procedure well with no immediate complications.     Medications Ordered in ED Medications  lidocaine (XYLOCAINE) 2 % injection 10 mL (10 mLs Other Given 01/16/18 2137)  sulfamethoxazole-trimethoprim (BACTRIM DS,SEPTRA DS) 800-160 MG per tablet 1 tablet (1 tablet Oral Given 01/16/18 2300)     Initial Impression / Assessment and Plan / ED Course  I have reviewed the triage vital signs and the nursing notes.  Pertinent labs & imaging results that were available during my care of the patient were reviewed  by me and considered in my medical decision making (see chart for details).     Home care discussed along with indications for needing f/u recheck.  Site small, expect this will resolve without complication. Bactrim, ibuprofen.   Final Clinical Impressions(s) / ED Diagnoses   Final diagnoses:  Abscess    ED Discharge Orders         Ordered    ibuprofen (ADVIL,MOTRIN) 800 MG tablet  3 times daily     01/16/18 2241    sulfamethoxazole-trimethoprim (BACTRIM DS,SEPTRA DS) 800-160 MG tablet  2 times daily     01/16/18 2243           Evalee Jefferson, PA-C 01/17/18 1333    Mesner, Corene Cornea, MD 01/19/18 2340

## 2018-02-19 ENCOUNTER — Other Ambulatory Visit: Payer: Medicaid Other

## 2018-02-25 ENCOUNTER — Ambulatory Visit: Payer: Medicaid Other | Admitting: Endocrinology

## 2018-02-26 ENCOUNTER — Ambulatory Visit (INDEPENDENT_AMBULATORY_CARE_PROVIDER_SITE_OTHER): Payer: Medicaid Other | Admitting: Nurse Practitioner

## 2018-02-26 ENCOUNTER — Encounter: Payer: Self-pay | Admitting: Nurse Practitioner

## 2018-02-26 ENCOUNTER — Encounter: Payer: Self-pay | Admitting: Gastroenterology

## 2018-02-26 VITALS — BP 107/71 | HR 52 | Temp 97.0°F | Ht 67.5 in | Wt 154.2 lb

## 2018-02-26 DIAGNOSIS — D649 Anemia, unspecified: Secondary | ICD-10-CM | POA: Diagnosis not present

## 2018-02-26 DIAGNOSIS — Z860100 Personal history of colon polyps, unspecified: Secondary | ICD-10-CM | POA: Insufficient documentation

## 2018-02-26 DIAGNOSIS — K5900 Constipation, unspecified: Secondary | ICD-10-CM | POA: Diagnosis not present

## 2018-02-26 NOTE — Assessment & Plan Note (Signed)
Noted constipation.  He drinks adequate water.  He admits he drinks minimal to no fibrous foods such as fruits, vegetables, whole grains.  His constipation is likely due to inadequate fiber intake.  He does have daily bowel movements but they are hard, incomplete emptying.  No hematochezia noted.  I recommended he increase his dietary fiber.  In the meantime he can take a fiber supplement 1-2 times a day.  Otherwise, he can try MiraLAX 1-2 times a day which he can increase or decrease based on his response.  Follow-up in 3 months.

## 2018-02-26 NOTE — Assessment & Plan Note (Signed)
History of colon polyps with last colonoscopy in 2013 which were hyperplastic and recommended 10-year repeat.  He is technically not due until 2023.  However, he does have a diagnosis of anemia.  No iron studies were done.  His CBC/RBC indices were normal which is suggestive of another etiology of anemia.  He has not noted bright red blood per rectum or melena.  I will recheck CBC, iron, ferritin today to try to delineate if he does have iron deficiency anemia.  He was not able to start his iron due to cost so if it is iron deficiency anemia his iron should still be low.  If he does have iron deficiency anemia then we can consider proceeding with an upper endoscopy and repeat colonoscopy for evaluation of possible GI etiology.  Otherwise, return for follow-up in 3 months.

## 2018-02-26 NOTE — Patient Instructions (Signed)
1. Your labs done when you are able to. 2. We will make further recommendations going to your lab results. 3. Return for follow-up in 3 months. 4. For your constipation increase your daily consumption of fiber foods such as whole grains, fruits, vegetables. 5. Alternatively you can try an over-the-counter fiber supplement such as Benefiber, fiber Gummies.  Store brands her chest is good his name brands.  You can also try MiraLAX if you do not want to try the fiber supplement.  Take this once a day, increase or decrease to twice a day or once every other day based on your response. 6. Call us if you have any questions or concerns.  At [facility] we value your feedback. You may receive a survey about your visit today. Please share your experience as we strive to create trusting relationships with our patients to provide genuine, compassionate, quality care.  We appreciate your understanding and patience as we review any laboratory studies, imaging, and other diagnostic tests that are ordered as we care for you. Our office policy is 5 business days for review of these results, and any emergent or urgent results are addressed in a timely manner for your best interest. If you do not hear from our office in 1 week, please contact us.   We also encourage the use of MyChart, which contains your medical information for your review as well. If you are not enrolled in this feature, an access code is on this after visit summary for your convenience. Thank you for allowing [place] to be involved in your care.  It was great to meet you today!  I hope you have a great Fall!!

## 2018-02-26 NOTE — Progress Notes (Signed)
Primary Care Physician:  [name], MD Primary Gastroenterologist:  Dr. Oneida Alar  Chief Complaint  Patient presents with  . Consult    TCS. last had done 5 years ago-had polyps removed.  . Constipation    BM's twice a day (straining at times, hard stool)    HPI:   Neil [name] is a 57 y.o. male who presents on referral from primary care for scheduling of colonoscopy and constipation.  The patient was previously seen by lower GI.  Reviewed information related to the referral including office visit dated 11/21/2017.  At that time the patient indicated daily bowel movements but noted to be hard.  Primary care review of history is indicated no previous colonoscopy.  Reviewed labs completed at that time which found mildly low hemoglobin of 11.5 which is normocytic and normochromic.  CMP essentially normal.  Diagnosed with iron deficiency anemia and started on iron sulfate 325 mg daily and vitamin C.  Referred to GI for screening colonoscopy.  Recommended follow-up in 3 months for updated labs.  Last colonoscopy in our system completed 12/21/2011 which found scattered ascending colon to sigmoid colon diverticula, 2 polyps in the rectosigmoid colon status post removal, polyp possibly in the rectum at site of previously plates suture status post biopsy, internal hemorrhoids, otherwise normal.  Surgical pathology found the polyps to be hyperplastic.  Recommended colonoscopy in 10 years (2023).  View of previous labs shows persistent mild anemia with a baseline of 11.7-12.7 since 2016. Was previously evaluated for folate and B12 deficiency by previous PCP (Dr. Amil Amen at [facility] in [place]).  Today he states he's doing ok overall. He hasn't been worked up for anemia. He was Rx iron at his last PCP visit but unable to afford the medication. He complains of constipation. Has a bowel movement daily with hard stools and straining and incomplete emptying. Drinks on average 68 oz of water  daily. Admits he doesn't eat enough fiber, minimal fruits/vegetables (maybe once a month), when he has bread eats whole grain sometimes (when he can afford it.) Denies GERD symptoms. Occasional/'rare gas pains which improves with passing gas. Denies abdominal pain, N/V, hematochezia, melena, fever, chills, unintentional weight loss.  Thinks he had an EGD while in prison in [place], but not sure. It was done to evaluate abdominal pain. States it was ok. This would have been about 15 years ago.  Past Medical History:  Diagnosis Date  . Anemia 03/13/2017  . Anxiety   . Bipolar disorder [facility]) age 50 yo  . Chest pain   . Depression   . Dyslipidemia, goal LDL below 100   . Headache   . Hypogonadism in male 03/13/2017  . Hypothyroidism   . Schizophrenia [facility]) age 78 yo  . 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  . Tobacco abuse     Past Surgical History:  Procedure Laterality Date  . CRANIOTOMY N/A 12/07/2016   Procedure: TRANSPHENOIDAL RESECTION OF TUMOR;  Surgeon: Newman Pies, MD;  Location: [place];  Service: Neurosurgery;  Laterality: N/A;  TRANSPHENOIDAL RESECTION OF TUMOR  . HEMORROIDECTOMY  2007  . TRANSNASAL APPROACH N/A 12/07/2016   Procedure: TRANSNASAL APPROACH;  Surgeon: Rozetta Nunnery, MD;  Location: [facility];  Service: ENT;  Laterality: N/A;    Current Outpatient Medications  Medication Sig Dispense Refill  . diphenhydrAMINE (BENADRYL) 25 mg capsule Take 1 capsule (25 mg total)  by mouth every 6 (six) hours as needed for itching. 30 capsule 0  . hydrocortisone (CORTEF) 10 MG tablet Take 1 tablet (10 mg total) by mouth daily. 30 tablet 3  . hydrocortisone cream 1 % Apply to affected area 2 times daily for 7 days. (Patient taking differently: as needed. Apply to affected area 2 times daily for 7 days.) 20 g 0  . ibuprofen (ADVIL,MOTRIN) 800 MG tablet Take 1 tablet (800 mg total)  by mouth 3 (three) times daily. (Patient taking differently: Take 800 mg by mouth 3 (three) times daily as needed. ) 21 tablet 0  . levothyroxine (SYNTHROID, LEVOTHROID) 112 MCG tablet Take 1 tablet (112 mcg total) by mouth daily. 30 tablet 1  . Naphazoline HCl (CLEAR EYES OP) Apply 1 drop to eye daily as needed (dry eyes).    Marland Kitchen testosterone (ANDROGEL) 50 MG/5GM (1%) GEL Place 7.5 g onto the skin daily. 45 Tube 2   No current facility-administered medications for this visit.     Allergies as of 02/26/2018 - Review Complete 02/26/2018  Allergen Reaction Noted  . No known allergies  12/06/2016    Family History  Problem Relation Age of Onset  . Hypertension Mother   . Stroke Mother   . Mental retardation Mother   . Heart failure Father   . Hypertension Sister   . Bipolar disorder Son   . Schizophrenia Son   . Cancer - Colon Neg Hx        Is pretty sure his dad did NOT have colon CA but not 100% sure  . Gastric cancer Neg Hx   . Esophageal cancer Neg Hx     Social History   Socioeconomic History  . Marital status: Single    Spouse name: Not on file  . Number of children: 2  . Years of education: 51  . Highest education level: Not on file  Occupational History  . Occupation: unemployed carpentry/upholstery  Social Needs  . Financial resource strain: Not on file  . Food insecurity:    Worry: Not on file    Inability: Not on file  . Transportation needs:    Medical: Not on file    Non-medical: Not on file  Tobacco Use  . Smoking status: Current Every Day Smoker    Packs/day: 0.50    Years: 40.00    Pack years: 20.00    Types: Cigarettes  . Smokeless tobacco: Never Used  Substance and Sexual Activity  . Alcohol use: No    Alcohol/week: 0.0 standard drinks  . Drug use: Not Currently    Types: Marijuana    Comment: denies use 01/16/18  . Sexual activity: Not Currently    Birth control/protection: None  Lifestyle  . Physical activity:    Days per week: Not on file      Minutes per session: Not on file  . Stress: Not on file  Relationships  . Social connections:    Talks on phone: Not on file    Gets together: Not on file    Attends religious service: Not on file    Active member of club or organization: Not on file    Attends meetings of clubs or organizations: Not on file    Relationship status: Not on file  . Intimate partner violence:    Fear of current or ex partner: Not on file    Emotionally abused: Not on file    Physically abused: Not on file    Forced sexual  activity: Not on file  Other Topics Concern  . Not on file  Social History Narrative   Born in [place], [place]   Has lived in [place] for years.   Currently lives with maternal uncle, who also smokes, but is trying to get his own place.     Working on disability.    Review of Systems: Complete ROS negative except as per HPI.    Physical Exam: BP 107/71   Pulse (!) 52   Temp (!) 97 F (36.1 C) (Oral)   Ht 5' 7.5" (1.715 m)   Wt 154 lb 3.2 oz (69.9 kg)   BMI 23.79 kg/m  General:   Alert and oriented. Pleasant and cooperative. Well-nourished and well-developed.  Head:  Normocephalic and atraumatic. Eyes:  Without icterus, sclera clear and conjunctiva pink.  Ears:  Normal auditory acuity. Cardiovascular:  S1, S2 present without murmurs appreciated. Extremities without clubbing or edema. Respiratory:  Clear to auscultation bilaterally. No wheezes, rales, or rhonchi. No distress.  Gastrointestinal:  +BS, soft, non-tender and non-distended. No HSM noted. No guarding or rebound. No masses appreciated.  Rectal:  Deferred  Musculoskalatal:  Symmetrical without gross deformities. Neurologic:  Alert and oriented x4;  grossly normal neurologically. Psych:  Alert and cooperative. Normal mood and affect. Heme/Lymph/Immune: No excessive bruising noted.    02/26/2018 11:36 AM   Disclaimer: This note was dictated with voice recognition software. Similar sounding words can  inadvertently be transcribed and may not be corrected upon review.

## 2018-02-26 NOTE — Assessment & Plan Note (Signed)
He was diagnosed with iron deficiency anemia by primary care.  His hemoglobin has been mildly low since 2016 which is as far back as our records ago.  His baseline is 11.7-12.7.  His RBC indices have been normal.  No iron or ferritin studies found in the system.  His folate and B12 were checked and normal.  At this point I will recheck a CBC, iron, ferritin.  If he does have iron deficiency anemia we consider early interval colonoscopy with upper endoscopy to evaluate for GI etiology.  Follow-up in 3 months.  Further recommendations to follow lab results.

## 2018-02-27 NOTE — Progress Notes (Signed)
CC'D TO PCP °

## 2018-03-14 ENCOUNTER — Other Ambulatory Visit: Payer: Self-pay | Admitting: Endocrinology

## 2018-03-15 NOTE — Telephone Encounter (Signed)
He needs to schedule his follow-up labs and office visit which he canceled in 9/19.  Once appointment is made we will send prescription

## 2018-03-15 NOTE — Telephone Encounter (Signed)
Is this okay to refill? 

## 2018-03-19 ENCOUNTER — Encounter: Payer: Self-pay | Admitting: "Endocrinology

## 2018-03-23 ENCOUNTER — Emergency Department (HOSPITAL_COMMUNITY): Payer: Medicaid Other

## 2018-03-23 ENCOUNTER — Encounter (HOSPITAL_COMMUNITY): Payer: Self-pay | Admitting: Emergency Medicine

## 2018-03-23 ENCOUNTER — Other Ambulatory Visit: Payer: Self-pay

## 2018-03-23 ENCOUNTER — Emergency Department (HOSPITAL_COMMUNITY)
Admission: EM | Admit: 2018-03-23 | Discharge: 2018-03-23 | Disposition: A | Discharge status: Home / Self Care | Payer: Medicaid Other | Attending: Emergency Medicine | Admitting: Emergency Medicine

## 2018-03-23 DIAGNOSIS — F17210 Nicotine dependence, cigarettes, uncomplicated: Secondary | ICD-10-CM | POA: Insufficient documentation

## 2018-03-23 DIAGNOSIS — Z79899 Other long term (current) drug therapy: Secondary | ICD-10-CM | POA: Insufficient documentation

## 2018-03-23 DIAGNOSIS — R1032 Left lower quadrant pain: Secondary | ICD-10-CM | POA: Insufficient documentation

## 2018-03-23 LAB — URINALYSIS, ROUTINE W REFLEX MICROSCOPIC
Bilirubin Urine: NEGATIVE
Glucose, UA: NEGATIVE mg/dL
Hgb urine dipstick: NEGATIVE
Ketones, ur: NEGATIVE mg/dL
LEUKOCYTES UA: NEGATIVE
Nitrite: NEGATIVE
PROTEIN: NEGATIVE mg/dL
Specific Gravity, Urine: 1.026 (ref 1.005–1.030)
pH: 5 (ref 5.0–8.0)

## 2018-03-23 MED ORDER — NAPROXEN 250 MG PO TABS: 250.0000 mg | ORAL_TABLET | Freq: Two times a day (BID) | ORAL | 0 refills | Status: DC | PRN

## 2018-03-23 NOTE — ED Provider Notes (Signed)
[facility] Provider Note   CSN: [id number] Arrival date & time: 03/23/18  1730     History   Chief Complaint Chief Complaint  Patient presents with  . Groin Pain    HPI Neil [name] is a 58 y.o. male.  HPI  Pt was seen at 1740.  Per pt, c/o gradual onset and persistence of constant left sided groin and testicular "pain" for the past 1 week. Pt states he "might have lifted something heavy at work" before his symptoms began.  Has been associated with no other symptoms..  Describes the pain as "aching."  Denies N/V, no diarrhea, no fevers, no back pain, no rash, no CP/SOB, no black or blood in stools, no penile drainage, no dysuria/hematuria, no testicular swelling, no perineal pain.      Past Medical History:  Diagnosis Date  . Anemia 03/13/2017  . Anxiety   . Bipolar disorder [facility]) age 37 yo  . Chest pain   . Depression   . Dyslipidemia, goal LDL below 100   . Headache   . Hypogonadism in male 03/13/2017  . Hypothyroidism   . Schizophrenia [facility]) age 16 yo  . 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  . Tobacco abuse     Patient Active Problem List   Diagnosis Date Noted  . Constipation 02/26/2018  . History of colonic polyps 02/26/2018  . Hypogonadism in male 03/13/2017  . Anemia 03/13/2017  . 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 Pituitary macroadenoma ([place]) 08/21/2016  . Tobacco abuse   . Dyslipidemia, goal LDL below 100   . Sebaceous cyst of left axilla 06/08/2015  . Boil of trunk 06/08/2015  . Healthcare maintenance 06/08/2015  . Lumbar radiculopathy 03/16/2015  . Urinary hesitancy 03/16/2015  . Hyperlipidemia 03/05/2015  . Chest pain 03/05/2015  . Depression 11/02/2014  . Schizophrenia ([place]) 11/02/2014    Past Surgical History:  Procedure Laterality Date  .  CRANIOTOMY N/A 12/07/2016   Procedure: TRANSPHENOIDAL RESECTION OF TUMOR;  Surgeon: Newman Pies, MD;  Location: [facility];  Service: Neurosurgery;  Laterality: N/A;  TRANSPHENOIDAL RESECTION OF TUMOR  . HEMORROIDECTOMY  2007  . TRANSNASAL APPROACH N/A 12/07/2016   Procedure: TRANSNASAL APPROACH;  Surgeon: Rozetta Nunnery, MD;  Location: [facility];  Service: ENT;  Laterality: N/A;        Home Medications    Prior to Admission medications   Medication Sig Start Date End Date Taking? Authorizing Provider  diphenhydrAMINE (BENADRYL) 25 mg capsule Take 1 capsule (25 mg total) by mouth every 6 (six) hours as needed for itching. 01/12/17   Long, Wonda Olds, MD  hydrocortisone (CORTEF) 10 MG tablet Take 1 tablet (10 mg total) by mouth daily. 12/25/17   Elayne Snare, MD  hydrocortisone cream 1 % Apply to affected area 2 times daily for 7 days. Patient taking differently: as needed. Apply to affected area 2 times daily for 7 days. 01/12/17   Long, Wonda Olds, MD  ibuprofen (ADVIL,MOTRIN) 800 MG tablet Take 1 tablet (800 mg total) by mouth 3 (three) times daily. Patient taking differently: Take 800 mg by mouth 3 (three) times daily as needed.  01/16/18   Evalee Jefferson, PA-C  levothyroxine (SYNTHROID, LEVOTHROID) 112 MCG tablet Take 1 tablet (112 mcg total) by mouth daily. 12/25/17   Elayne Snare, MD  Naphazoline HCl (CLEAR  EYES OP) Apply 1 drop to eye daily as needed (dry eyes).    [provider]  testosterone (ANDROGEL) 50 MG/5GM (1%) GEL Place 7.5 g onto the skin daily. 12/25/17   Elayne Snare, MD    Family History Family History  Problem Relation Age of Onset  . Hypertension Mother   . Stroke Mother   . Mental retardation Mother   . Heart failure Father   . Hypertension Sister   . Bipolar disorder Son   . Schizophrenia Son   . Cancer - Colon Neg Hx        Is pretty sure his dad did NOT have colon CA but not 100% sure  . Gastric cancer Neg Hx   . Esophageal cancer Neg Hx     Social  History Social History   Tobacco Use  . Smoking status: Current Every Day Smoker    Packs/day: 0.50    Years: 40.00    Pack years: 20.00    Types: Cigarettes  . Smokeless tobacco: Never Used  Substance Use Topics  . Alcohol use: No    Alcohol/week: 0.0 standard drinks  . Drug use: Not Currently    Types: Marijuana    Comment: denies use 01/16/18     Allergies   No known allergies   Review of Systems Review of Systems ROS: Statement: All systems negative except as marked or noted in the HPI; Constitutional: Negative for fever and chills. ; ; Eyes: Negative for eye pain, redness and discharge. ; ; ENMT: Negative for ear pain, hoarseness, nasal congestion, sinus pressure and sore throat. ; ; Cardiovascular: Negative for chest pain, palpitations, diaphoresis, dyspnea and peripheral edema. ; ; Respiratory: Negative for cough, wheezing and stridor. ; ; Gastrointestinal: Negative for nausea, vomiting, diarrhea, abdominal pain, blood in stool, hematemesis, jaundice and rectal bleeding. . ; ; Genitourinary: Negative for dysuria, flank pain and hematuria. ; ; Musculoskeletal: Negative for back pain and neck pain. Negative for swelling and trauma.; ; Genital:  No penile drainage or rash, +left groin and testicular pain, no testicular swelling, no scrotal rash or swelling. ;; Skin: Negative for pruritus, rash, abrasions, blisters, bruising and skin lesion.; ; Neuro: Negative for headache, lightheadedness and neck stiffness. Negative for weakness, altered level of consciousness, altered mental status, extremity weakness, paresthesias, involuntary movement, seizure and syncope.       Physical Exam Updated Vital Signs BP 115/71 (BP Location: Right Arm)   Pulse 74   Temp 97.8 F (36.6 C) (Temporal)   Resp 18   Ht 5\' 7"  (1.702 m)   Wt 71.7 kg   SpO2 97%   BMI 24.75 kg/m   Physical Exam 1745: Physical examination:  Nursing notes reviewed; Vital signs and O2 SAT reviewed;  Constitutional:  Well developed, Well nourished, Well hydrated, In no acute distress; Head:  Normocephalic, atraumatic; Eyes: EOMI, PERRL, No scleral icterus; ENMT: Mouth and pharynx normal, Mucous membranes moist; Neck: Supple, Full range of motion, No lymphadenopathy; Cardiovascular: Regular rate and rhythm, No gallop; Respiratory: Breath sounds clear & equal bilaterally, No wheezes.  Speaking full sentences with ease, Normal respiratory effort/excursion; Chest: Nontender, Movement normal; Abdomen: Soft, Nontender, Nondistended, Normal bowel sounds; Genitourinary: No CVA tenderness. Genital exam performed with pt permission and ED RN chaperone present during exam. No perineal erythema.  No penile lesions or drainage.  No scrotal erythema, edema or ecchymosis.  Normal testicular lie. +mild tenderness proximal to left lateral testicle, no erythema, no edema, no ecchymosis, No right testicular tenderness  to palp.  +cremasteric reflexes bilat.  No inguinal LAN or palpable masses. ;;; Extremities: Peripheral pulses normal, No tenderness, No edema, No calf edema or asymmetry.; Neuro: AA&Ox3, Major CN grossly intact.  Speech clear. No gross focal motor or sensory deficits in extremities.; Skin: Color normal, Warm, Dry.    ED Treatments / Results  Labs (all labs ordered are listed, but only abnormal results are displayed)   EKG None  Radiology   Procedures Procedures (including critical care time)  Medications Ordered in ED Medications - No data to display   Initial Impression / Assessment and Plan / ED Course  I have reviewed the triage vital signs and the nursing notes.  Pertinent labs & imaging results that were available during my care of the patient were reviewed by me and considered in my medical decision making (see chart for details).  MDM Reviewed: previous chart, nursing note and vitals Interpretation: labs, ultrasound and CT scan   Results for orders placed or performed during the hospital  encounter of 03/23/18  Urinalysis, Routine w reflex microscopic  Result Value Ref Range   Color, Urine YELLOW YELLOW   APPearance HAZY (A) CLEAR   Specific Gravity, Urine 1.026 1.005 - 1.030   pH 5.0 5.0 - 8.0   Glucose, UA NEGATIVE NEGATIVE mg/dL   Hgb urine dipstick NEGATIVE NEGATIVE   Bilirubin Urine NEGATIVE NEGATIVE   Ketones, ur NEGATIVE NEGATIVE mg/dL   Protein, ur NEGATIVE NEGATIVE mg/dL   Nitrite NEGATIVE NEGATIVE   Leukocytes, UA NEGATIVE NEGATIVE   Ct Renal Stone Study Result Date: 03/23/2018 CLINICAL DATA:  LEFT-sided groin pain after lifting heavy objects a few days ago. Swelling. EXAM: CT ABDOMEN AND PELVIS WITHOUT CONTRAST TECHNIQUE: Multidetector CT imaging of the abdomen and pelvis was performed following the standard protocol without IV contrast. COMPARISON:  None. FINDINGS: Lower chest: No acute abnormality. Hepatobiliary: No focal liver abnormality is seen. No gallstones, gallbladder wall thickening, or biliary dilatation. Pancreas: Unremarkable. No pancreatic ductal dilatation or surrounding inflammatory changes. Spleen: Normal in size without focal abnormality. Adrenals/Urinary Tract: Adrenal glands appear normal. Kidneys are unremarkable without stone or hydronephrosis. No perinephric fluid. No ureteral or bladder calculi identified. Bladder is unremarkable, decompressed. Stomach/Bowel: No dilated large or small bowel loops. No evidence of bowel wall inflammation. Scattered diverticulosis within the colon but no focal inflammatory change to suggest acute diverticulitis. Appendix is normal. Stomach is unremarkable. Vascular/Lymphatic: No significant vascular findings are present. No enlarged abdominal or pelvic lymph nodes. Reproductive: Prostate is unremarkable. Other: No free fluid or abscess collection. No free intraperitoneal air. Musculoskeletal: No acute or suspicious osseous finding. Degenerative spondylitic changes noted within the mid and lower lumbar spine, most  pronounced at the L4-5 level where there is disc space narrowing, vacuum disc phenomenon and osseous spurring. Possible associated neural foramen encroachment on the LEFT at this level. Soft tissues about the LEFT groin are unremarkable. No soft tissue mass or edema. No hernia. No herniated bowel. IMPRESSION: 1. No acute findings within the abdomen or pelvis. No bowel obstruction or evidence of bowel wall inflammation. No evidence of acute solid organ abnormality. No renal or ureteral calculi. Appendix is normal. LEFT groin/inguinal region is unremarkable. 2. Mild colonic diverticulosis without evidence of acute diverticulitis. 3. Degenerative spondylitic changes within the lumbar spine, most pronounced at the L4-5 level where there is possible associated neural foramen encroachment on the LEFT. If symptoms could be radiculopathic in nature, would consider nonemergent outpatient lumbar spine MRI to exclude associated nerve root  impingement. Electronically Signed   By: Franki Cabot M.D.   On: 03/23/2018 19:12    US Scrotum W/doppler Result Date: 03/23/2018 CLINICAL DATA:  LEFT groin pain for 2-3 days after lifting heavy objects. EXAM: SCROTAL ULTRASOUND DOPPLER ULTRASOUND OF THE TESTICLES TECHNIQUE: Complete ultrasound examination of the testicles, epididymis, and other scrotal structures was performed. Color and spectral Doppler ultrasound were also utilized to evaluate blood flow to the testicles. COMPARISON:  None. FINDINGS: Right testicle Measurements: 3.6 x 1.7 x 2.4 cm. No mass or microlithiasis visualized. Left testicle Measurements: 3.4 x 1.4 x 2.5 cm. No mass or microlithiasis visualized. Right epididymis:  Normal in size and appearance. Left epididymis:  Normal in size and appearance. Hydrocele:  Small bilateral hydroceles. Varicocele:  None visualized. Pulsed Doppler interrogation of both testes demonstrates normal low resistance arterial and venous waveforms bilaterally. IMPRESSION: Essentially  normal scrotal ultrasound. Both testicles appear normal. No evidence of testicular torsion or orchitis. No evidence of epididymitis. No evidence of inguinal hernia in the LEFT groin region. Electronically Signed   By: Franki Cabot M.D.   On: 03/23/2018 19:06    2000:  Workup reassuring. Tx symptomatically at this time. Dx and testing d/w pt and family.  Questions answered.  Verb understanding, agreeable to d/c home with outpt f/u.    Final Clinical Impressions(s) / ED Diagnoses   Final diagnoses:  None    ED Discharge Orders    None       Francine Graven, DO 03/27/18 0725

## 2018-03-23 NOTE — ED Triage Notes (Signed)
Pt states lifting something heavy a few days ago and now has left side groin pain. Pt reports little bit of swelling.

## 2018-03-23 NOTE — Discharge Instructions (Signed)
Take the prescription as directed. Also take over the counter tylenol, as directed on packaging, as needed for discomfort. Apply moist heat or ice to the area(s) of discomfort, for 15 minutes at a time, several times per day for the next few days.  Do not fall asleep on a heating or ice pack.  Call your regular medical doctor and the Urologist on Monday to schedule a follow up appointment this week.  Return to the Emergency Department immediately if worsening.

## 2018-03-24 LAB — URINE CULTURE: CULTURE: NO GROWTH

## 2018-04-11 ENCOUNTER — Telehealth: Payer: Self-pay | Admitting: Endocrinology

## 2018-04-11 ENCOUNTER — Other Ambulatory Visit: Payer: Self-pay

## 2018-04-11 MED ORDER — LEVOTHYROXINE SODIUM 112 MCG PO TABS: 112.0000 ug | ORAL_TABLET | Freq: Every day | ORAL | 1 refills | Status: DC

## 2018-04-11 MED ORDER — TESTOSTERONE 50 MG/5GM (1%) TD GEL: 7.5000 g | Freq: Every day | TRANSDERMAL | 2 refills | Status: DC

## 2018-04-11 MED ORDER — HYDROCORTISONE 1 % EX CREA: TOPICAL_CREAM | CUTANEOUS | 0 refills | Status: DC

## 2018-04-11 NOTE — Telephone Encounter (Signed)
Please refill testosterone in Dr. Jodelle Green absence

## 2018-04-11 NOTE — Telephone Encounter (Signed)
Patient stated he needs the following medications sent into the pharmacy he is completely out    levothyroxine (SYNTHROID, LEVOTHROID) 112 MCG tablet    testosterone (ANDROGEL) 50 MG/5GM (1%) GEL    hydrocortisone (CORTEF) 10 MG tablet    [id number] - [place], [place] - [address] AT [address]

## 2018-04-11 NOTE — Telephone Encounter (Signed)
done

## 2018-04-23 ENCOUNTER — Telehealth: Payer: Self-pay | Admitting: Endocrinology

## 2018-04-23 ENCOUNTER — Other Ambulatory Visit: Payer: Self-pay

## 2018-04-23 MED ORDER — HYDROCORTISONE 10 MG PO TABS: 10.0000 mg | ORAL_TABLET | Freq: Every day | ORAL | 3 refills | Status: DC

## 2018-04-23 NOTE — Telephone Encounter (Signed)
This has been sent

## 2018-04-23 NOTE — Telephone Encounter (Signed)
Patient stated he needs Rx's [facility] to [facility] [place]. hydrocortisone (CORTEF) 10 MG tablet Patient is stating to also fill androgel but I informed them that it was filled on October 31st.

## 2018-04-24 ENCOUNTER — Telehealth: Payer: Self-pay | Admitting: Endocrinology

## 2018-04-24 NOTE — Telephone Encounter (Signed)
PA or switch please advise

## 2018-04-24 NOTE — Telephone Encounter (Signed)
Needs to first make a follow-up appointment with labs

## 2018-04-24 NOTE — Telephone Encounter (Signed)
Patient stated that he needs an authorization from the Doctor for his medication to be filled.   testosterone (ANDROGEL) 50 MG/5GM (1%) GEL   [id number] - [place], [place] - [address] AT [address]

## 2018-04-25 NOTE — Telephone Encounter (Signed)
Made a f/u appointment patient wants to know if he can get labs same day and if he will be able to get a refill to get him through until his appointment please advise

## 2018-04-26 ENCOUNTER — Other Ambulatory Visit: Payer: Self-pay | Admitting: Endocrinology

## 2018-04-26 MED ORDER — TESTOSTERONE 50 MG/5GM (1%) TD GEL: 7.5000 g | Freq: Every day | TRANSDERMAL | 1 refills | Status: DC

## 2018-04-26 NOTE — Telephone Encounter (Signed)
Prescription has been sent, need to make sure he is applying 1-1/2 tubes of the testosterone cream every single day since his blood test was low last time.  He can do labs same day

## 2018-04-29 ENCOUNTER — Other Ambulatory Visit: Payer: Self-pay | Admitting: Endocrinology

## 2018-05-20 ENCOUNTER — Telehealth: Payer: Self-pay

## 2018-05-20 NOTE — Telephone Encounter (Signed)
He is apparently scheduled to see the endocrinologist in [place].   Please find out first if he is transferring his care.  We need to then cancel his appointment on 12/20

## 2018-05-20 NOTE — Telephone Encounter (Signed)
Could you please refill Androgel?

## 2018-05-21 ENCOUNTER — Other Ambulatory Visit: Payer: Self-pay | Admitting: Endocrinology

## 2018-05-21 NOTE — Telephone Encounter (Signed)
Spoke to the patient and made him aware of new pt appointment with Dr. Dorris Fetch patient assumed he could see both doctors- he is going to cancel appointment with Dr. Dorris Fetch as he would like to continue care with Dr. Volney American him aware of Androgel refill

## 2018-05-21 NOTE — Telephone Encounter (Signed)
He can only get a 30-day prescription.  He does have an appointment to see Dr. Dorris Fetch in [place] on 12/29 who is an endocrinologist

## 2018-05-21 NOTE — Telephone Encounter (Signed)
Spoke with patient and he has not scheduled to see a new Endocrinologist and plans on keeping 05/31/18 appointment-patient is also requesting refills for thyroid medication and cortisone which I can e-scribe if appropriate but we need MD to send in Androgel-please advise if ok to fill other medications

## 2018-05-30 ENCOUNTER — Encounter: Payer: Self-pay | Admitting: Gastroenterology

## 2018-05-30 ENCOUNTER — Telehealth: Payer: Self-pay | Admitting: Gastroenterology

## 2018-05-30 ENCOUNTER — Ambulatory Visit: Payer: Self-pay | Admitting: "Endocrinology

## 2018-05-30 ENCOUNTER — Ambulatory Visit: Payer: Medicaid Other | Admitting: Nurse Practitioner

## 2018-05-30 NOTE — Telephone Encounter (Signed)
Patient was a no show and letter sent  °

## 2018-05-31 ENCOUNTER — Encounter: Payer: Self-pay | Admitting: Endocrinology

## 2018-05-31 ENCOUNTER — Ambulatory Visit (INDEPENDENT_AMBULATORY_CARE_PROVIDER_SITE_OTHER): Payer: Medicaid Other | Admitting: Endocrinology

## 2018-05-31 VITALS — BP 114/72 | HR 71 | Ht 67.0 in | Wt 156.0 lb

## 2018-05-31 DIAGNOSIS — E230 Hypopituitarism: Secondary | ICD-10-CM

## 2018-05-31 DIAGNOSIS — D497 Neoplasm of unspecified behavior of endocrine glands and other parts of nervous system: Secondary | ICD-10-CM

## 2018-05-31 MED ORDER — TESTOSTERONE 50 MG/5GM (1%) TD GEL: TRANSDERMAL | 1 refills | Status: DC

## 2018-05-31 MED ORDER — LEVOTHYROXINE SODIUM 112 MCG PO TABS: 112.0000 ug | ORAL_TABLET | Freq: Every day | ORAL | 1 refills | Status: DC

## 2018-05-31 MED ORDER — HYDROCORTISONE 10 MG PO TABS: 10.0000 mg | ORAL_TABLET | Freq: Every day | ORAL | 3 refills | Status: DC
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
## 2018-06-03 ENCOUNTER — Telehealth: Payer: Self-pay

## 2018-06-03 NOTE — Telephone Encounter (Signed)
PA approved for Tesosterone 50mg /5gm(1%) gel. PA done through [employer] call center.  PA #: [id number] PA good through 06/03/2018-05/29/2019.  Call reference #:[id number]

## 2018-07-08 ENCOUNTER — Other Ambulatory Visit: Payer: Self-pay

## 2018-07-08 ENCOUNTER — Telehealth: Payer: Self-pay | Admitting: Endocrinology

## 2018-07-08 ENCOUNTER — Other Ambulatory Visit: Payer: Self-pay | Admitting: Endocrinology

## 2018-07-08 MED ORDER — LEVOTHYROXINE SODIUM 112 MCG PO TABS: 112.0000 ug | ORAL_TABLET | Freq: Every day | ORAL | 1 refills | Status: DC

## 2018-07-08 MED ORDER — HYDROCORTISONE 10 MG PO TABS: 10.0000 mg | ORAL_TABLET | Freq: Every day | ORAL | 3 refills | Status: DC

## 2018-07-08 NOTE — Telephone Encounter (Signed)
Please refill Testosterone if appropriate. I will send all other medications.

## 2018-07-08 NOTE — Telephone Encounter (Signed)
Called pt and informed him that he should have 1 refill left, and he stated that he would call the pharmacy and have it filled.

## 2018-07-08 NOTE — Telephone Encounter (Signed)
His testosterone prescription from last month had 1 refill

## 2018-07-08 NOTE — Telephone Encounter (Signed)
MEDICATION:  Testosterone, Levothyroxine 112 mcg and Hydrocortisone 10 mg  PHARMACY:  [facility] on [address] in [place]  IS THIS A 90 DAY SUPPLY : No  IS PATIENT OUT OF MEDICATION: YES  IF NOT; HOW MUCH IS LEFT:   LAST APPOINTMENT DATE: @12 /23/2019  NEXT APPOINTMENT DATE:@2 /11/2018  DO WE HAVE YOUR PERMISSION TO LEAVE A DETAILED MESSAGE:   OTHER COMMENTS:    **Let patient know to contact pharmacy at the end of the day to make sure medication is ready. **  ** Please notify patient to allow 48-72 hours to process**  **Encourage patient to contact the pharmacy for refills or they can request refills through [facility]**

## 2018-07-18 ENCOUNTER — Ambulatory Visit: Payer: Medicaid Other | Admitting: Endocrinology

## 2018-07-18 ENCOUNTER — Telehealth: Payer: Self-pay | Admitting: Endocrinology

## 2018-07-18 NOTE — Telephone Encounter (Signed)
Patient no showed today's appt. Please advise on how to follow up. °A. No follow up necessary. °B. Follow up urgent. Contact patient immediately. °C. Follow up necessary. Contact patient and schedule visit in ___ days. °D. Follow up advised. Contact patient and schedule visit in ____weeks. ° °Would you like the NS fee to be applied to this visit? ° °

## 2018-07-18 NOTE — Telephone Encounter (Signed)
Follow up necessary. Contact patient and schedule visit in 10-15___ days. This is his third no-show and he should be aware that we can dismiss him for any further no-shows

## 2018-07-25 ENCOUNTER — Ambulatory Visit (INDEPENDENT_AMBULATORY_CARE_PROVIDER_SITE_OTHER): Payer: Medicaid Other | Admitting: "Endocrinology

## 2018-07-25 ENCOUNTER — Encounter: Payer: Self-pay | Admitting: "Endocrinology

## 2018-07-25 VITALS — BP 117/79 | HR 58 | Temp 97.9°F | Ht 69.6 in | Wt 159.2 lb

## 2018-07-25 DIAGNOSIS — E230 Hypopituitarism: Secondary | ICD-10-CM | POA: Diagnosis not present

## 2018-07-25 MED ORDER — HYDROCORTISONE 10 MG PO TABS: ORAL_TABLET | ORAL | 12 refills | Status: DC

## 2018-07-25 MED ORDER — TESTOSTERONE 50 MG/5GM (1%) TD GEL: TRANSDERMAL | 3 refills | Status: DC

## 2018-07-25 MED ORDER — LEVOTHYROXINE SODIUM 112 MCG PO TABS: 112.0000 ug | ORAL_TABLET | Freq: Every day | ORAL | 12 refills | Status: DC
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
## 2018-07-25 NOTE — Progress Notes (Signed)
Neil [name], CMA  

## 2018-07-26 ENCOUNTER — Encounter: Payer: Self-pay | Admitting: "Endocrinology

## 2018-08-07 ENCOUNTER — Telehealth: Payer: Self-pay

## 2018-08-07 NOTE — Telephone Encounter (Signed)
Attempted to contact pt to see if he still plans on having the MRI done that Dr Dorris Fetch ordered since there have been several attempts to contact him concerning this.  No answer.  Left message.

## 2018-08-19 ENCOUNTER — Ambulatory Visit: Payer: Medicaid Other | Admitting: Endocrinology

## 2018-08-23 ENCOUNTER — Telehealth: Payer: Self-pay | Admitting: Endocrinology

## 2018-08-23 NOTE — Telephone Encounter (Signed)
Patient no showed today's appt. Please advise on how to follow up. °A. No follow up necessary. °B. Follow up urgent. Contact patient immediately. °C. Follow up necessary. Contact patient and schedule visit in ___ days. °D. Follow up advised. Contact patient and schedule visit in ____weeks. ° °Would you like the NS fee to be applied to this visit? ° °

## 2018-08-26 ENCOUNTER — Ambulatory Visit: Payer: Medicaid Other | Admitting: "Endocrinology

## 2018-08-26 NOTE — Telephone Encounter (Signed)
This patient has already transferred to [facility] endocrinology

## 2018-08-30 ENCOUNTER — Other Ambulatory Visit: Payer: Self-pay

## 2018-08-30 MED ORDER — LEVOTHYROXINE SODIUM 112 MCG PO TABS: 112.0000 ug | ORAL_TABLET | Freq: Every day | ORAL | 5 refills | Status: DC

## 2018-08-30 MED ORDER — HYDROCORTISONE 10 MG PO TABS: ORAL_TABLET | ORAL | 5 refills | Status: DC

## 2018-09-05 ENCOUNTER — Telehealth: Payer: Self-pay

## 2018-09-05 DIAGNOSIS — E230 Hypopituitarism: Secondary | ICD-10-CM

## 2018-09-05 MED ORDER — TESTOSTERONE 50 MG/5GM (1%) TD GEL: TRANSDERMAL | 3 refills | Status: DC

## 2018-09-05 NOTE — Telephone Encounter (Signed)
LeighAnn Alison Kubicki, CMA  

## 2018-09-09 ENCOUNTER — Other Ambulatory Visit: Payer: Self-pay

## 2018-09-09 DIAGNOSIS — E230 Hypopituitarism: Secondary | ICD-10-CM

## 2018-09-10 ENCOUNTER — Encounter: Payer: Medicaid Other | Admitting: "Endocrinology

## 2018-09-11 ENCOUNTER — Other Ambulatory Visit: Payer: Self-pay

## 2018-09-12 ENCOUNTER — Other Ambulatory Visit: Payer: Self-pay | Admitting: "Endocrinology

## 2018-09-12 DIAGNOSIS — E230 Hypopituitarism: Secondary | ICD-10-CM

## 2018-09-12 LAB — TESTOSTERONE, FREE, TOTAL, SHBG
Sex Hormone Binding: 48.2 nmol/L (ref 19.3–76.4)
Testosterone, Free: 8.6 pg/mL (ref 7.2–24.0)
Testosterone: 477 ng/dL (ref 264–916)

## 2018-09-12 MED ORDER — TESTOSTERONE 50 MG/5GM (1%) TD GEL: TRANSDERMAL | 3 refills | Status: DC

## 2018-09-17 ENCOUNTER — Other Ambulatory Visit: Payer: Self-pay | Admitting: Endocrinology

## 2018-09-17 DIAGNOSIS — E230 Hypopituitarism: Secondary | ICD-10-CM

## 2018-09-17 NOTE — Telephone Encounter (Signed)
Please refill if appropriate

## 2018-09-17 NOTE — Telephone Encounter (Signed)
He is now under the care of Dr. Dorris Fetch

## 2018-09-18 ENCOUNTER — Other Ambulatory Visit: Payer: Self-pay

## 2018-09-18 ENCOUNTER — Ambulatory Visit (INDEPENDENT_AMBULATORY_CARE_PROVIDER_SITE_OTHER): Payer: Medicaid Other | Admitting: "Endocrinology

## 2018-09-18 ENCOUNTER — Encounter: Payer: Self-pay | Admitting: "Endocrinology

## 2018-09-18 DIAGNOSIS — E230 Hypopituitarism: Secondary | ICD-10-CM | POA: Diagnosis not present
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
## 2018-09-24 ENCOUNTER — Encounter: Payer: Medicaid Other | Admitting: "Endocrinology

## 2018-11-13 ENCOUNTER — Telehealth: Payer: Self-pay | Admitting: "Endocrinology

## 2018-11-13 NOTE — Telephone Encounter (Signed)
Pt would like to know what the side effects are for hydrocortisone (CORTEF) 10 MG tablet & levothyroxine (SYNTHROID, LEVOTHROID) 112 MCG tablet.  He said he thinks one of these causes of Shortness of Breath.   [phone]

## 2018-11-13 NOTE — Telephone Encounter (Signed)
No side effects from these medications at the current appropriate doses.

## 2018-11-14 ENCOUNTER — Ambulatory Visit (HOSPITAL_COMMUNITY): Payer: Medicaid Other

## 2018-11-14 NOTE — Telephone Encounter (Signed)
Pt notified by VM 

## 2018-11-15 ENCOUNTER — Telehealth: Payer: Self-pay | Admitting: "Endocrinology

## 2018-11-15 ENCOUNTER — Telehealth: Payer: Self-pay

## 2018-11-15 NOTE — Telephone Encounter (Signed)
MRI PA# approved

## 2018-11-15 NOTE — Telephone Encounter (Signed)
-----   Message from Cassandria Anger, MD sent at 11/14/2018  6:36 PM EDT ----- Regarding: FW: need preauth pls Maudie Mercury, Is this doable? ----- Message ----- From: Augustine Radar Sent: 11/13/2018  10:21 AM EDT To: Cassandria Anger, MD, # Subject: need preauth pls                               Hello, his mri will need a preauth thru his insurance co.  Thanks, April

## 2018-11-15 NOTE — Telephone Encounter (Signed)
MRI PA approved

## 2018-11-25 ENCOUNTER — Other Ambulatory Visit: Payer: Self-pay | Admitting: "Endocrinology

## 2018-11-25 ENCOUNTER — Ambulatory Visit (HOSPITAL_COMMUNITY)
Admission: RE | Admit: 2018-11-25 | Discharge: 2018-11-25 | Disposition: A | Discharge status: Home / Self Care | Payer: Medicaid Other | Source: Ambulatory Visit | Attending: "Endocrinology | Admitting: "Endocrinology

## 2018-11-25 ENCOUNTER — Other Ambulatory Visit: Payer: Self-pay

## 2018-11-25 DIAGNOSIS — E230 Hypopituitarism: Secondary | ICD-10-CM | POA: Insufficient documentation

## 2018-12-11 ENCOUNTER — Encounter (HOSPITAL_COMMUNITY): Payer: Self-pay | Admitting: Emergency Medicine

## 2018-12-11 ENCOUNTER — Other Ambulatory Visit: Payer: Self-pay

## 2018-12-11 ENCOUNTER — Emergency Department (HOSPITAL_COMMUNITY)
Admission: EM | Admit: 2018-12-11 | Discharge: 2018-12-11 | Disposition: A | Discharge status: Home / Self Care | Payer: Medicaid Other | Attending: Emergency Medicine | Admitting: Emergency Medicine

## 2018-12-11 DIAGNOSIS — E039 Hypothyroidism, unspecified: Secondary | ICD-10-CM | POA: Insufficient documentation

## 2018-12-11 DIAGNOSIS — R200 Anesthesia of skin: Secondary | ICD-10-CM | POA: Diagnosis present

## 2018-12-11 DIAGNOSIS — F17210 Nicotine dependence, cigarettes, uncomplicated: Secondary | ICD-10-CM | POA: Insufficient documentation

## 2018-12-11 DIAGNOSIS — Z79899 Other long term (current) drug therapy: Secondary | ICD-10-CM | POA: Diagnosis not present

## 2018-12-11 DIAGNOSIS — R51 Headache: Secondary | ICD-10-CM | POA: Diagnosis not present

## 2018-12-11 DIAGNOSIS — R202 Paresthesia of skin: Secondary | ICD-10-CM

## 2018-12-11 NOTE — ED Provider Notes (Signed)
[facility] EMERGENCY DEPARTMENT Provider Note   CSN: [id number] Arrival date & time: 12/11/18  0044     History   Chief Complaint Chief Complaint  Patient presents with  . Numbness    HPI Neil [name] is a 58 y.o. male.     The history is provided by the patient.  Neurologic Problem This is a new problem. The current episode started 6 to 12 hours ago. The problem occurs constantly. The problem has not changed since onset.Associated symptoms include headaches. Pertinent negatives include no chest pain and no shortness of breath. Nothing aggravates the symptoms. Nothing relieves the symptoms.  Patient with history of depression, anxiety presents with left leg numbness.  He reports it started earlier today.  It is from the left calf down to his foot.  It is mostly on the lateral aspect.  No other weakness or numbness is reported.  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  He thinks this might be the culprit.  He reports mild headache, but reports he stayed up with his hydration  Past Medical History:  Diagnosis Date  . Anemia 03/13/2017  . Anxiety   . Bipolar disorder [facility]) age 52 yo  . Chest pain   . Depression   . Dyslipidemia, goal LDL below 100   . Headache   . Hypogonadism in male 03/13/2017  . Hypothyroidism   . Schizophrenia [facility]) age 46 yo  . 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  . Tobacco abuse     Patient Active Problem List   Diagnosis Date Noted  . Panhypopituitarism ([place]) 07/25/2018  . Constipation 02/26/2018  . History of colonic polyps 02/26/2018  . Hypogonadism in male 03/13/2017  . Anemia 03/13/2017  . 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 Pituitary macroadenoma with extrasellar extension ([place]) 12/07/2016  . Pituitary macroadenoma ([place]) 08/21/2016  . Tobacco abuse   . Dyslipidemia,  goal LDL below 100   . Sebaceous cyst of left axilla 06/08/2015  . Boil of trunk 06/08/2015  . Healthcare maintenance 06/08/2015  . Lumbar radiculopathy 03/16/2015  . Urinary hesitancy 03/16/2015  . Hyperlipidemia 03/05/2015  . Chest pain 03/05/2015  . Depression 11/02/2014  . Schizophrenia ([place]) 11/02/2014    Past Surgical History:  Procedure Laterality Date  . CRANIOTOMY N/A 12/07/2016   Procedure: TRANSPHENOIDAL RESECTION OF TUMOR;  Surgeon: Newman Pies, MD;  Location: [place];  Service: Neurosurgery;  Laterality: N/A;  TRANSPHENOIDAL RESECTION OF TUMOR  . HEMORROIDECTOMY  2007  . TRANSNASAL APPROACH N/A 12/07/2016   Procedure: TRANSNASAL APPROACH;  Surgeon: Rozetta Nunnery, MD;  Location: [place];  Service: ENT;  Laterality: N/A;        Home Medications    Prior to Admission medications   Medication Sig Start Date End Date Taking? Authorizing Provider  hydrocortisone (CORTEF) 10 MG tablet Take 10 mg by mouth everyday at 8AM and 5 mg everyday at 1PM 08/30/18   Cassandria Anger, MD  hydrocortisone cream 1 % Apply to affected area 2 times daily for 7 days. 04/11/18   Elayne Snare, MD  levothyroxine (SYNTHROID, LEVOTHROID) 112 MCG tablet Take 1 tablet (112 mcg total) by mouth daily before breakfast. 08/30/18   Nida, Marella Chimes, MD  Multiple Vitamin (MULTIVITAMIN WITH MINERALS) TABS tablet Take 1 tablet by mouth daily.  [provider]  Naphazoline HCl (CLEAR EYES OP) Apply 1 drop to eye daily as needed (dry eyes).    [provider]  naproxen (NAPROSYN) 250 MG tablet Take 1 tablet (250 mg total) by mouth 2 (two) times daily as needed for mild pain or moderate pain (take with food). 03/23/18   Francine Graven, DO  testosterone (ANDROGEL) 50 MG/5GM (1%) GEL Apply 1 tube on alternating upper arm every morning 09/12/18   Cassandria Anger, MD    Family History Family History  Problem Relation Age of Onset  . Hypertension Mother   . Stroke  Mother   . Mental retardation Mother   . Heart failure Father   . Hypertension Sister   . Bipolar disorder Son   . Schizophrenia Son   . Cancer - Colon Neg Hx        Is pretty sure his dad did NOT have colon CA but not 100% sure  . Gastric cancer Neg Hx   . Esophageal cancer Neg Hx     Social History Social History   Tobacco Use  . Smoking status: Current Every Day Smoker    Packs/day: 0.50    Years: 40.00    Pack years: 20.00    Types: Cigarettes  . Smokeless tobacco: Never Used  Substance Use Topics  . Alcohol use: No    Alcohol/week: 0.0 standard drinks  . Drug use: Not Currently    Types: Marijuana    Comment: denies use 01/16/18     Allergies   No known allergies   Review of Systems Review of Systems  Constitutional: Negative for fever.  Eyes: Negative for visual disturbance.  Respiratory: Negative for shortness of breath.   Cardiovascular: Negative for chest pain.  Neurological: Positive for numbness and headaches. Negative for weakness.  All other systems reviewed and are negative.    Physical Exam Updated Vital Signs BP (!) 138/91 (BP Location: Left Arm)   Pulse 73   Temp 97.9 F (36.6 C) (Oral)   Resp 17   Ht 1.702 m (5\' 7" )   Wt 71.2 kg   SpO2 99%   BMI 24.59 kg/m   Physical Exam  CONSTITUTIONAL: Well developed/well nourished HEAD: Normocephalic/atraumatic EYES: EOMI/PERRL, no nystagmus, no ptosis ENMT: Mucous membranes moist NECK: supple no meningeal signs, no bruits CV: S1/S2 noted, no murmurs/rubs/gallops noted LUNGS: Lungs are clear to auscultation bilaterally, no apparent distress ABDOMEN: soft, nontender, no rebound or guarding GU:no cva tenderness NEURO:Awake/alert, face symmetric, no arm or leg drift is noted Equal 5/5 strength with shoulder abduction, elbow flex/extension, wrist flex/extension in upper extremities and equal hand grips bilaterally Equal 5/5 strength with hip flexion,knee flex/extension, foot dorsi/plantar flexion  Cranial nerves 3/4/5/6/12/18/08/11/12 tested and intact Gait normal without ataxia  great toe extension intact bilaterally, no clonus bilaterally, plantar reflex appropriate (toes downgoing), Equal patellar/achilles reflex noted (2+) in bilateral lower extremities.  No past pointing Sensation to light touch intact in all extremities except lateral aspect of distal left LE EXTREMITIES: pulses normal, full ROM, no deformities, no erythema/bruising to left LE, no edema noted SKIN: warm, color normal PSYCH: no abnormalities of mood noted  ED Treatments / Results  Labs (all labs ordered are listed, but only abnormal results are displayed) Labs Reviewed - No data to display  EKG None  Radiology No results found.  Procedures Procedures  Medications Ordered in ED Medications - No data to display   Initial Impression / Assessment and Plan / ED Course  I  have reviewed the triage vital signs and the nursing notes.       Patient with history of pit. mass, s/p resection isolated numbness to the lateral aspect of distal left leg Other than subjective numbness on exam, he is neurovascularly intact Suspect this may be due to him standing on a ladder all day. No signs of acute stroke He is well appearing  Plan is to monitor symptoms at home.  I suspect this will improve in the next 2 to 3 days. If it does not improve within a week, he should follow-up with PCP for further evaluation. However if there is any worsening of numbness, new weakness, difficulty walking, or strokelike symptoms he must return to the ER  Final Clinical Impressions(s) / ED Diagnoses   Final diagnoses:  Paresthesia    ED Discharge Orders    None       Ripley Fraise, MD 12/11/18 0128

## 2018-12-11 NOTE — ED Triage Notes (Signed)
Pt states he was standing on his left foot "most of the day while painting and standing on a ladder." Pt states from his left ankle down into his foot in numb. Denies any other numbness.

## 2018-12-16 ENCOUNTER — Other Ambulatory Visit: Payer: Self-pay | Admitting: "Endocrinology

## 2019-01-27 ENCOUNTER — Ambulatory Visit: Payer: Medicaid Other | Admitting: "Endocrinology

## 2019-01-30 LAB — TESTOSTERONE, FREE, TOTAL, SHBG
Sex Hormone Binding: 49.6 nmol/L (ref 19.3–76.4)
Testosterone, Free: 0.9 pg/mL — ABNORMAL LOW (ref 7.2–24.0)
Testosterone: 131 ng/dL — ABNORMAL LOW (ref 264–916)

## 2019-02-11 ENCOUNTER — Ambulatory Visit (INDEPENDENT_AMBULATORY_CARE_PROVIDER_SITE_OTHER): Payer: Medicaid Other | Admitting: "Endocrinology

## 2019-02-11 ENCOUNTER — Other Ambulatory Visit: Payer: Self-pay

## 2019-02-11 ENCOUNTER — Encounter: Payer: Self-pay | Admitting: "Endocrinology

## 2019-02-11 DIAGNOSIS — E230 Hypopituitarism: Secondary | ICD-10-CM

## 2019-02-11 DIAGNOSIS — E291 Testicular hypofunction: Secondary | ICD-10-CM

## 2019-02-11 DIAGNOSIS — E2740 Unspecified adrenocortical insufficiency: Secondary | ICD-10-CM

## 2019-02-11 DIAGNOSIS — E039 Hypothyroidism, unspecified: Secondary | ICD-10-CM

## 2019-02-11 MED ORDER — TESTOSTERONE 50 MG/5GM (1%) TD GEL: TRANSDERMAL | 2 refills | Status: DC
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
## 2019-03-05 ENCOUNTER — Telehealth: Payer: Self-pay

## 2019-03-05 ENCOUNTER — Other Ambulatory Visit: Payer: Self-pay | Admitting: "Endocrinology

## 2019-03-05 DIAGNOSIS — D352 Benign neoplasm of pituitary gland: Secondary | ICD-10-CM

## 2019-03-05 DIAGNOSIS — E230 Hypopituitarism: Secondary | ICD-10-CM

## 2019-03-05 MED ORDER — HYDROCORTISONE 10 MG PO TABS: ORAL_TABLET | ORAL | 5 refills | Status: DC

## 2019-03-05 NOTE — Telephone Encounter (Signed)
LeighAnn Twylla Arceneaux, CMA  

## 2019-04-03 ENCOUNTER — Other Ambulatory Visit: Payer: Self-pay | Admitting: "Endocrinology

## 2019-04-03 ENCOUNTER — Other Ambulatory Visit: Payer: Self-pay

## 2019-04-03 DIAGNOSIS — E230 Hypopituitarism: Secondary | ICD-10-CM

## 2019-04-03 NOTE — Telephone Encounter (Signed)
Which dosage should pt be on.

## 2019-04-22 ENCOUNTER — Other Ambulatory Visit: Payer: Self-pay

## 2019-04-22 DIAGNOSIS — E230 Hypopituitarism: Secondary | ICD-10-CM

## 2019-06-16 ENCOUNTER — Ambulatory Visit: Payer: Medicaid Other | Admitting: "Endocrinology

## 2019-06-17 ENCOUNTER — Ambulatory Visit: Payer: Medicaid Other | Admitting: "Endocrinology

## 2019-06-20 LAB — COMPLETE METABOLIC PANEL WITH GFR
AG Ratio: 2 (calc) (ref 1.0–2.5)
ALT: 17 U/L (ref 9–46)
AST: 18 U/L (ref 10–35)
Albumin: 4.5 g/dL (ref 3.6–5.1)
Alkaline phosphatase (APISO): 55 U/L (ref 35–144)
BUN: 15 mg/dL (ref 7–25)
CO2: 26 mmol/L (ref 20–32)
Calcium: 10.1 mg/dL (ref 8.6–10.3)
Chloride: 107 mmol/L (ref 98–110)
Creat: 1.18 mg/dL (ref 0.70–1.33)
GFR, Est African American: 78 mL/min/{1.73_m2} (ref >=60)
GFR, Est Non African American: 67 mL/min/{1.73_m2} (ref >=60)
Globulin: 2.3 g/dL (calc) (ref 1.9–3.7)
Glucose, Bld: 98 mg/dL (ref 65–99)
Potassium: 4.7 mmol/L (ref 3.5–5.3)
Sodium: 141 mmol/L (ref 135–146)
Total Bilirubin: 0.5 mg/dL (ref 0.2–1.2)
Total Protein: 6.8 g/dL (ref 6.1–8.1)

## 2019-06-20 LAB — TEST AUTHORIZATION

## 2019-06-20 LAB — VITAMIN D 25 HYDROXY (VIT D DEFICIENCY, FRACTURES): Vit D, 25-Hydroxy: 14 ng/mL — ABNORMAL LOW (ref 30–100)

## 2019-06-20 LAB — TSH: TSH: 0.02 mIU/L — ABNORMAL LOW (ref 0.40–4.50)

## 2019-06-20 LAB — T4, FREE: Free T4: 1.7 ng/dL (ref 0.8–1.8)

## 2019-06-20 LAB — TESTOSTERONE: Testosterone: 477 ng/dL (ref 250–827)

## 2019-06-27 ENCOUNTER — Ambulatory Visit (INDEPENDENT_AMBULATORY_CARE_PROVIDER_SITE_OTHER): Payer: Medicaid Other | Admitting: "Endocrinology

## 2019-06-27 ENCOUNTER — Encounter: Payer: Self-pay | Admitting: "Endocrinology

## 2019-06-27 DIAGNOSIS — E230 Hypopituitarism: Secondary | ICD-10-CM

## 2019-06-27 DIAGNOSIS — D352 Benign neoplasm of pituitary gland: Secondary | ICD-10-CM

## 2019-06-27 MED ORDER — LEVOTHYROXINE SODIUM 100 MCG PO TABS: 100.0000 ug | ORAL_TABLET | Freq: Every day | ORAL | 1 refills | Status: DC

## 2019-06-27 MED ORDER — HYDROCORTISONE 10 MG PO TABS: ORAL_TABLET | ORAL | 5 refills | Status: DC
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
## 2019-09-25 ENCOUNTER — Ambulatory Visit (INDEPENDENT_AMBULATORY_CARE_PROVIDER_SITE_OTHER): Payer: Medicaid Other | Admitting: "Endocrinology

## 2019-09-25 ENCOUNTER — Other Ambulatory Visit: Payer: Self-pay

## 2019-09-25 ENCOUNTER — Encounter: Payer: Self-pay | Admitting: "Endocrinology

## 2019-09-25 VITALS — BP 116/81 | HR 72 | Ht 68.0 in | Wt 160.6 lb

## 2019-09-25 DIAGNOSIS — E2740 Unspecified adrenocortical insufficiency: Secondary | ICD-10-CM | POA: Diagnosis not present

## 2019-09-25 DIAGNOSIS — D352 Benign neoplasm of pituitary gland: Secondary | ICD-10-CM

## 2019-09-25 DIAGNOSIS — E039 Hypothyroidism, unspecified: Secondary | ICD-10-CM | POA: Diagnosis not present

## 2019-09-25 DIAGNOSIS — E230 Hypopituitarism: Secondary | ICD-10-CM

## 2019-09-25 MED ORDER — TESTOSTERONE 50 MG/5GM (1%) TD GEL: TRANSDERMAL | 2 refills | Status: DC

## 2019-09-25 MED ORDER — LEVOTHYROXINE SODIUM 100 MCG PO TABS: 100.0000 ug | ORAL_TABLET | Freq: Every day | ORAL | 1 refills | Status: DC

## 2019-09-25 MED ORDER — HYDROCORTISONE 10 MG PO TABS: ORAL_TABLET | ORAL | 5 refills | Status: DC
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
## 2019-10-01 ENCOUNTER — Telehealth: Payer: Self-pay | Admitting: "Endocrinology

## 2019-10-01 NOTE — Telephone Encounter (Signed)
Patient said he still is not able to get his testosterone. Please advise.

## 2019-10-01 NOTE — Telephone Encounter (Signed)
We can send injectable T if he is interested.

## 2019-10-03 NOTE — Telephone Encounter (Signed)
Left a message requesting a return call to the office. 

## 2019-10-06 NOTE — Telephone Encounter (Signed)
Pt requested a prior authorization for testosterone gel. We will start the prior authorization process.

## 2019-11-17 ENCOUNTER — Telehealth: Payer: Self-pay | Admitting: "Endocrinology

## 2019-11-17 MED ORDER — TESTOSTERONE 50 MG/5GM (1%) TD GEL: TRANSDERMAL | 2 refills | Status: DC

## 2019-11-17 NOTE — Telephone Encounter (Signed)
Patient is calling and requesting a refill on the following medication.  testosterone (ANDROGEL) 50 MG/5GM (1%) GEL  Pt states the pharmacy is showing no refills.  [id number] - [place], [place] [phone] (Phone) [phone] (Fax)

## 2019-11-17 NOTE — Telephone Encounter (Signed)
Rx refill sent.

## 2019-12-01 LAB — BASIC METABOLIC PANEL
BUN: 16 (ref 4–21)
CO2: 24 — AB (ref 13–22)
Chloride: 104 (ref 99–108)
Creatinine: 1.2 (ref 0.6–1.3)
Glucose: 87
Potassium: 4.8 (ref 3.4–5.3)
Sodium: 141 (ref 137–147)

## 2019-12-01 LAB — HEPATIC FUNCTION PANEL
ALT: 27 (ref 10–40)
AST: 26 (ref 14–40)
Alkaline Phosphatase: 59 (ref 25–125)
Bilirubin, Total: 0.5

## 2019-12-01 LAB — COMPREHENSIVE METABOLIC PANEL
Albumin: 4.9 (ref 3.5–5.0)
Calcium: 10.4 (ref 8.7–10.7)
GFR calc Af Amer: 78
GFR calc non Af Amer: 67
Globulin: 2.1

## 2019-12-01 LAB — CBC AND DIFFERENTIAL
HCT: 40 — AB (ref 41–53)
Hemoglobin: 13.7 (ref 13.5–17.5)
Neutrophils Absolute: 3
Platelets: 357 (ref 150–399)
WBC: 6.2

## 2019-12-01 LAB — CBC: RBC: 4.36 (ref 3.87–5.11)

## 2019-12-01 LAB — PSA: PSA: 0.4

## 2019-12-02 LAB — LIPID PANEL
Cholesterol: 256 — AB (ref 0–200)
HDL: 39 (ref 35–70)
LDL Cholesterol: 181
Triglycerides: 192 — AB (ref 40–160)

## 2019-12-02 LAB — TSH: TSH: 0.05 — AB (ref 0.41–5.90)

## 2019-12-24 ENCOUNTER — Other Ambulatory Visit: Payer: Self-pay | Admitting: "Endocrinology

## 2019-12-26 ENCOUNTER — Other Ambulatory Visit: Payer: Self-pay

## 2019-12-26 DIAGNOSIS — E2740 Unspecified adrenocortical insufficiency: Secondary | ICD-10-CM

## 2019-12-26 DIAGNOSIS — E291 Testicular hypofunction: Secondary | ICD-10-CM

## 2019-12-26 DIAGNOSIS — E039 Hypothyroidism, unspecified: Secondary | ICD-10-CM

## 2019-12-29 ENCOUNTER — Ambulatory Visit: Payer: Medicaid Other | Admitting: "Endocrinology

## 2020-01-14 LAB — PROLACTIN: Prolactin: 9.5 ng/mL (ref 4.0–15.2)

## 2020-01-14 LAB — TESTOSTERONE: Testosterone: 53 ng/dL — ABNORMAL LOW (ref 264–916)

## 2020-01-14 LAB — T4, FREE: Free T4: 1.22 ng/dL (ref 0.82–1.77)

## 2020-01-19 ENCOUNTER — Other Ambulatory Visit: Payer: Self-pay

## 2020-01-19 ENCOUNTER — Ambulatory Visit (INDEPENDENT_AMBULATORY_CARE_PROVIDER_SITE_OTHER): Payer: Medicaid Other | Admitting: "Endocrinology

## 2020-01-19 ENCOUNTER — Encounter: Payer: Self-pay | Admitting: "Endocrinology

## 2020-01-19 VITALS — BP 100/76 | HR 72 | Ht 68.0 in | Wt 158.6 lb

## 2020-01-19 DIAGNOSIS — E2740 Unspecified adrenocortical insufficiency: Secondary | ICD-10-CM

## 2020-01-19 DIAGNOSIS — E291 Testicular hypofunction: Secondary | ICD-10-CM | POA: Diagnosis not present

## 2020-01-19 DIAGNOSIS — D352 Benign neoplasm of pituitary gland: Secondary | ICD-10-CM

## 2020-01-19 DIAGNOSIS — E039 Hypothyroidism, unspecified: Secondary | ICD-10-CM

## 2020-01-19 DIAGNOSIS — E230 Hypopituitarism: Secondary | ICD-10-CM

## 2020-01-19 MED ORDER — HYDROCORTISONE 10 MG PO TABS: ORAL_TABLET | ORAL | 5 refills | Status: DC

## 2020-01-19 MED ORDER — VITAMIN D3 125 MCG (5000 UT) PO CAPS
5000.0000 [IU] | ORAL_CAPSULE | Freq: Every day | ORAL | 1 refills | Status: DC
Start: 2020-01-19 — End: 2020-04-26

## 2020-01-19 MED ORDER — LEVOTHYROXINE SODIUM 100 MCG PO TABS: 100.0000 ug | ORAL_TABLET | Freq: Every day | ORAL | 1 refills | Status: DC
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
## 2020-02-03 ENCOUNTER — Other Ambulatory Visit: Payer: Self-pay | Admitting: "Endocrinology

## 2020-02-03 DIAGNOSIS — E291 Testicular hypofunction: Secondary | ICD-10-CM

## 2020-02-03 MED ORDER — ANDROGEL 50 MG/5GM (1%) TD GEL: TRANSDERMAL | 0 refills | Status: DC

## 2020-02-03 NOTE — Telephone Encounter (Signed)
Patient is requesting a refill on the following medication and states it needs a prior authorization  ANDROGEL 50 MG/5GM (1%) GEL    [id number] - [place], [place] - [address] [place] Phone:  [phone]  Fax:  [phone]

## 2020-02-03 NOTE — Telephone Encounter (Signed)
Rx sent to [facility], awaiting notification from pharmacy of prior authoriztion initiation.

## 2020-03-02 ENCOUNTER — Other Ambulatory Visit: Payer: Self-pay | Admitting: "Endocrinology

## 2020-03-02 DIAGNOSIS — E291 Testicular hypofunction: Secondary | ICD-10-CM

## 2020-03-31 ENCOUNTER — Telehealth: Payer: Self-pay

## 2020-03-31 DIAGNOSIS — E291 Testicular hypofunction: Secondary | ICD-10-CM

## 2020-03-31 MED ORDER — TESTOSTERONE 50 MG/5GM (1%) TD GEL: TRANSDERMAL | 0 refills | Status: DC

## 2020-03-31 NOTE — Telephone Encounter (Signed)
Rx refill sent.

## 2020-03-31 NOTE — Telephone Encounter (Signed)
Pt requesting refill on testosterone (ANDROGEL) 50 MG/5GM (1%) GEL. [place]

## 2020-04-26 ENCOUNTER — Other Ambulatory Visit: Payer: Self-pay | Admitting: "Endocrinology

## 2020-04-26 ENCOUNTER — Telehealth: Payer: Self-pay | Admitting: "Endocrinology

## 2020-04-26 MED ORDER — VITAMIN D3 125 MCG (5000 UT) PO CAPS: 5000.0000 [IU] | ORAL_CAPSULE | Freq: Every day | ORAL | 1 refills | Status: DC

## 2020-04-26 NOTE — Telephone Encounter (Signed)
Yes, they were called in for him on January 19, 2020 for vitamin D3 5000 units daily.  I will refresh the prescription.

## 2020-04-26 NOTE — Telephone Encounter (Signed)
Called pt, left message to advise that it was sent in

## 2020-04-26 NOTE — Telephone Encounter (Signed)
I returned call to patient, he stated that you were going to call him in some vitamins due to his thyroid that he would need a supplement , please advise.

## 2020-04-26 NOTE — Telephone Encounter (Signed)
Pt called and states that Dr Dorris Fetch was going to prescribe him vitamins at his last appt but states they were never called in?. Pt is requesting a call back.  [id number] - [place], [place] Phone:  [phone]  Fax:  [phone]

## 2020-04-28 NOTE — Telephone Encounter (Signed)
Called pharmacy, Vitamin D3 5000 units is not covered under prescription plan as it is an OTC medication, returned call to patient and advised him of this, verbalized understanding.

## 2020-04-28 NOTE — Telephone Encounter (Signed)
Can you please contact the pharmacy about this RX. Pt states theyre requesting a referral.. informed patient that referral is not used for a medication. Patient is unsure what they are requesting

## 2020-05-10 ENCOUNTER — Other Ambulatory Visit: Payer: Self-pay | Admitting: "Endocrinology

## 2020-05-10 DIAGNOSIS — E291 Testicular hypofunction: Secondary | ICD-10-CM

## 2020-05-11 ENCOUNTER — Telehealth: Payer: Self-pay

## 2020-05-11 ENCOUNTER — Other Ambulatory Visit: Payer: Self-pay | Admitting: "Endocrinology

## 2020-05-11 DIAGNOSIS — E291 Testicular hypofunction: Secondary | ICD-10-CM

## 2020-05-11 MED ORDER — TIROSINT 100 MCG PO CAPS: 100.0000 ug | ORAL_CAPSULE | Freq: Every day | ORAL | 1 refills | Status: DC

## 2020-05-11 MED ORDER — TESTOSTERONE 50 MG/5GM (1%) TD GEL: 5.0000 g | Freq: Every day | TRANSDERMAL | 0 refills | Status: DC

## 2020-05-11 NOTE — Telephone Encounter (Signed)
Called patient- he said that really wants something other than what he is taking due to the rash. He has missed one dose of his androgel, will that be okay?

## 2020-05-11 NOTE — Telephone Encounter (Signed)
Patient is asking if he can stop taking his synthroid because he is breaking out and it is causing him to have low immune system. Also,he is asking for a refill on his Androgel.

## 2020-05-11 NOTE — Telephone Encounter (Signed)
He can not stop taking his thyroid hormone. He absolutely needs it and synthroid is more pure than the generic levothyroxine. He needs to do his labs and keep clinic visits. I will send in a rx for androgel.

## 2020-05-11 NOTE — Telephone Encounter (Signed)
Pt.notified

## 2020-05-11 NOTE — Telephone Encounter (Signed)
One last alternative is Tirosint which costs significantly more than synthroid.I will send a rx to his pharmacy. One missed dose of Androgel is OK.

## 2020-05-19 LAB — TESTOSTERONE, FREE, TOTAL, SHBG
Sex Hormone Binding: 27.4 nmol/L (ref 19.3–76.4)
Testosterone, Free: 5.7 pg/mL — ABNORMAL LOW (ref 7.2–24.0)
Testosterone: 389 ng/dL (ref 264–916)

## 2020-05-19 LAB — T4, FREE: Free T4: 1.16 ng/dL (ref 0.82–1.77)

## 2020-05-19 LAB — PROLACTIN: Prolactin: 6.5 ng/mL (ref 4.0–15.2)

## 2020-05-19 LAB — PSA: Prostate Specific Ag, Serum: 0.4 ng/mL (ref 0.0–4.0)

## 2020-05-20 ENCOUNTER — Ambulatory Visit (INDEPENDENT_AMBULATORY_CARE_PROVIDER_SITE_OTHER): Payer: Medicaid Other | Admitting: "Endocrinology

## 2020-05-20 ENCOUNTER — Other Ambulatory Visit: Payer: Self-pay

## 2020-05-20 ENCOUNTER — Encounter: Payer: Self-pay | Admitting: "Endocrinology

## 2020-05-20 VITALS — BP 110/64 | HR 56 | Ht 68.0 in | Wt 164.0 lb

## 2020-05-20 DIAGNOSIS — E291 Testicular hypofunction: Secondary | ICD-10-CM | POA: Diagnosis not present

## 2020-05-20 DIAGNOSIS — D352 Benign neoplasm of pituitary gland: Secondary | ICD-10-CM

## 2020-05-20 DIAGNOSIS — E2740 Unspecified adrenocortical insufficiency: Secondary | ICD-10-CM

## 2020-05-20 DIAGNOSIS — E039 Hypothyroidism, unspecified: Secondary | ICD-10-CM | POA: Diagnosis not present

## 2020-05-20 DIAGNOSIS — E230 Hypopituitarism: Secondary | ICD-10-CM

## 2020-05-20 MED ORDER — HYDROCORTISONE 10 MG PO TABS: ORAL_TABLET | ORAL | 1 refills | Status: DC
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
## 2020-07-02 ENCOUNTER — Other Ambulatory Visit: Payer: Self-pay | Admitting: "Endocrinology

## 2020-07-02 DIAGNOSIS — E291 Testicular hypofunction: Secondary | ICD-10-CM

## 2020-09-09 ENCOUNTER — Other Ambulatory Visit: Payer: Self-pay | Admitting: "Endocrinology

## 2020-09-09 DIAGNOSIS — E291 Testicular hypofunction: Secondary | ICD-10-CM

## 2020-09-21 ENCOUNTER — Ambulatory Visit: Payer: Medicaid Other | Admitting: "Endocrinology

## 2020-09-23 ENCOUNTER — Encounter: Payer: Self-pay | Admitting: "Endocrinology

## 2020-09-23 ENCOUNTER — Other Ambulatory Visit: Payer: Self-pay

## 2020-09-23 ENCOUNTER — Ambulatory Visit (INDEPENDENT_AMBULATORY_CARE_PROVIDER_SITE_OTHER): Payer: Medicaid Other | Admitting: "Endocrinology

## 2020-09-23 VITALS — BP 114/78 | HR 60 | Ht 68.0 in | Wt 163.0 lb

## 2020-09-23 DIAGNOSIS — E230 Hypopituitarism: Secondary | ICD-10-CM | POA: Diagnosis not present

## 2020-09-23 DIAGNOSIS — E039 Hypothyroidism, unspecified: Secondary | ICD-10-CM

## 2020-09-23 DIAGNOSIS — E291 Testicular hypofunction: Secondary | ICD-10-CM | POA: Diagnosis not present

## 2020-09-23 DIAGNOSIS — E2740 Unspecified adrenocortical insufficiency: Secondary | ICD-10-CM

## 2020-09-23 LAB — COMPREHENSIVE METABOLIC PANEL
ALT: 16 IU/L (ref 0–44)
AST: 23 IU/L (ref 0–40)
Albumin/Globulin Ratio: 2.4 — ABNORMAL HIGH (ref 1.2–2.2)
Albumin: 4.5 g/dL (ref 3.8–4.9)
Alkaline Phosphatase: 59 IU/L (ref 44–121)
BUN/Creatinine Ratio: 12 (ref 10–24)
BUN: 16 mg/dL (ref 8–27)
Bilirubin Total: 0.4 mg/dL (ref 0.0–1.2)
CO2: 22 mmol/L (ref 20–29)
Calcium: 9.6 mg/dL (ref 8.6–10.2)
Chloride: 104 mmol/L (ref 96–106)
Creatinine, Ser: 1.33 mg/dL — ABNORMAL HIGH (ref 0.76–1.27)
Globulin, Total: 1.9 g/dL (ref 1.5–4.5)
Glucose: 95 mg/dL (ref 65–99)
Potassium: 4.7 mmol/L (ref 3.5–5.2)
Sodium: 139 mmol/L (ref 134–144)
Total Protein: 6.4 g/dL (ref 6.0–8.5)
eGFR: 61 mL/min/{1.73_m2} (ref >59)

## 2020-09-23 LAB — CBC WITH DIFFERENTIAL/PLATELET
Basophils Absolute: 0 10*3/uL (ref 0.0–0.2)
Basos: 1 %
EOS (ABSOLUTE): 0.2 10*3/uL (ref 0.0–0.4)
Eos: 3 %
Hematocrit: 36.6 % — ABNORMAL LOW (ref 37.5–51.0)
Hemoglobin: 12 g/dL — ABNORMAL LOW (ref 13.0–17.7)
Immature Grans (Abs): 0 10*3/uL (ref 0.0–0.1)
Immature Granulocytes: 0 %
Lymphocytes Absolute: 3.6 10*3/uL — ABNORMAL HIGH (ref 0.7–3.1)
Lymphs: 54 %
MCH: 30.9 pg (ref 26.6–33.0)
MCHC: 32.8 g/dL (ref 31.5–35.7)
MCV: 94 fL (ref 79–97)
Monocytes Absolute: 0.4 10*3/uL (ref 0.1–0.9)
Monocytes: 7 %
Neutrophils Absolute: 2.4 10*3/uL (ref 1.4–7.0)
Neutrophils: 35 %
Platelets: 341 10*3/uL (ref 150–450)
RBC: 3.88 x10E6/uL — ABNORMAL LOW (ref 4.14–5.80)
RDW: 13.3 % (ref 11.6–15.4)
WBC: 6.7 10*3/uL (ref 3.4–10.8)

## 2020-09-23 LAB — T4, FREE: Free T4: 1.19 ng/dL (ref 0.82–1.77)

## 2020-09-23 LAB — TESTOSTERONE, FREE, TOTAL, SHBG
Sex Hormone Binding: 38.7 nmol/L (ref 19.3–76.4)
Testosterone, Free: 1 pg/mL — ABNORMAL LOW (ref 6.6–18.1)
Testosterone: 164 ng/dL — ABNORMAL LOW (ref 264–916)
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
## 2020-10-14 ENCOUNTER — Other Ambulatory Visit: Payer: Self-pay | Admitting: "Endocrinology

## 2020-10-14 ENCOUNTER — Other Ambulatory Visit: Payer: Self-pay

## 2020-10-14 DIAGNOSIS — E291 Testicular hypofunction: Secondary | ICD-10-CM

## 2020-10-14 MED ORDER — TESTOSTERONE 50 MG/5GM (1%) TD GEL: TRANSDERMAL | 1 refills | Status: DC

## 2020-10-27 ENCOUNTER — Telehealth: Payer: Self-pay

## 2020-10-27 NOTE — Telephone Encounter (Signed)
Pharmacy has prescription. PA had to be done which was approved today.

## 2020-10-27 NOTE — Telephone Encounter (Signed)
Pt requesting refill on testosterone (ANDROGEL) 50 MG/5GM (1%) GEL. [facility] on [address]

## 2020-10-28 ENCOUNTER — Other Ambulatory Visit: Payer: Self-pay

## 2020-10-28 ENCOUNTER — Other Ambulatory Visit: Payer: Self-pay | Admitting: "Endocrinology

## 2020-10-28 DIAGNOSIS — E291 Testicular hypofunction: Secondary | ICD-10-CM

## 2020-10-28 NOTE — Telephone Encounter (Signed)
Pt.notified

## 2021-01-26 LAB — T4, FREE: Free T4: 1.23 ng/dL (ref 0.82–1.77)

## 2021-01-26 LAB — TESTOSTERONE, FREE, TOTAL, SHBG
Sex Hormone Binding: 39.4 nmol/L (ref 19.3–76.4)
Testosterone, Free: 0.5 pg/mL — ABNORMAL LOW (ref 6.6–18.1)
Testosterone: 71 ng/dL — ABNORMAL LOW (ref 264–916)

## 2021-01-27 ENCOUNTER — Ambulatory Visit (INDEPENDENT_AMBULATORY_CARE_PROVIDER_SITE_OTHER): Payer: Medicaid Other | Admitting: "Endocrinology

## 2021-01-27 ENCOUNTER — Encounter: Payer: Self-pay | Admitting: "Endocrinology

## 2021-01-27 ENCOUNTER — Other Ambulatory Visit: Payer: Self-pay

## 2021-01-27 VITALS — BP 104/78 | HR 64 | Ht 68.0 in | Wt 160.0 lb

## 2021-01-27 DIAGNOSIS — E291 Testicular hypofunction: Secondary | ICD-10-CM

## 2021-01-27 DIAGNOSIS — E039 Hypothyroidism, unspecified: Secondary | ICD-10-CM | POA: Diagnosis not present

## 2021-01-27 DIAGNOSIS — E2740 Unspecified adrenocortical insufficiency: Secondary | ICD-10-CM

## 2021-01-27 DIAGNOSIS — E230 Hypopituitarism: Secondary | ICD-10-CM

## 2021-01-27 MED ORDER — TESTOSTERONE 50 MG/5GM (1%) TD GEL: TRANSDERMAL | 0 refills | Status: DC
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
## 2021-01-28 ENCOUNTER — Other Ambulatory Visit: Payer: Self-pay | Admitting: "Endocrinology

## 2021-01-28 DIAGNOSIS — E291 Testicular hypofunction: Secondary | ICD-10-CM

## 2021-01-31 ENCOUNTER — Other Ambulatory Visit: Payer: Self-pay | Admitting: "Endocrinology

## 2021-01-31 DIAGNOSIS — E291 Testicular hypofunction: Secondary | ICD-10-CM

## 2021-02-01 ENCOUNTER — Other Ambulatory Visit: Payer: Self-pay

## 2021-02-01 ENCOUNTER — Telehealth: Payer: Self-pay

## 2021-02-01 DIAGNOSIS — E291 Testicular hypofunction: Secondary | ICD-10-CM

## 2021-02-01 MED ORDER — TESTOSTERONE 50 MG/5GM (1%) TD GEL
TRANSDERMAL | 2 refills | Status: DC
Start: 2021-02-01 — End: 2021-03-07

## 2021-02-01 NOTE — Telephone Encounter (Signed)
Pt is asking for a refill on his testosterone (ANDROGEL) 50 MG/5GM (1%) GEL.   Please advise

## 2021-02-01 NOTE — Telephone Encounter (Signed)
Resent script to pharmacy

## 2021-03-04 ENCOUNTER — Other Ambulatory Visit: Payer: Self-pay | Admitting: "Endocrinology

## 2021-03-04 DIAGNOSIS — E291 Testicular hypofunction: Secondary | ICD-10-CM

## 2021-03-06 ENCOUNTER — Other Ambulatory Visit: Payer: Self-pay | Admitting: "Endocrinology

## 2021-03-06 DIAGNOSIS — E291 Testicular hypofunction: Secondary | ICD-10-CM

## 2021-03-07 ENCOUNTER — Other Ambulatory Visit: Payer: Self-pay | Admitting: "Endocrinology

## 2021-03-07 NOTE — Telephone Encounter (Signed)
Left pt message that is rx is ready for pick up

## 2021-03-07 NOTE — Telephone Encounter (Signed)
APPLY 1 PACKET ON ALTERNATE SHOULDER EVERY MORNING AROUND 8AM  ( TOTAL OF 50MG  OF TESTOSTERONE ) DAILY DOSE.

## 2021-03-28 ENCOUNTER — Other Ambulatory Visit: Payer: Self-pay

## 2021-03-28 ENCOUNTER — Telehealth: Payer: Self-pay

## 2021-03-28 DIAGNOSIS — D352 Benign neoplasm of pituitary gland: Secondary | ICD-10-CM

## 2021-03-28 DIAGNOSIS — E230 Hypopituitarism: Secondary | ICD-10-CM

## 2021-03-28 MED ORDER — HYDROCORTISONE 10 MG PO TABS
ORAL_TABLET | ORAL | 1 refills | Status: DC
Start: 2021-03-28 — End: 2021-08-05

## 2021-03-28 NOTE — Telephone Encounter (Signed)
Patient requesting refill on Hydrocortisone 10mg . Please send to [facility] on [address]

## 2021-03-28 NOTE — Telephone Encounter (Signed)
Refill has been sent to the requested pharmacy for patient.

## 2021-04-08 ENCOUNTER — Telehealth: Payer: Self-pay

## 2021-04-08 NOTE — Telephone Encounter (Signed)
Pt is requesting a refill on his Androgel, he said that the pharmacy told him to call us. [facility] on [address]

## 2021-04-08 NOTE — Telephone Encounter (Signed)
Patient has 2 refills. Called pharmacy and this was on back order and they have back in stock now. Pharmacy verbalized an understanding.  Called patient to let him know that they are processing for him. Patient verbalized an understanding and thanked me.

## 2021-04-12 ENCOUNTER — Telehealth: Payer: Self-pay

## 2021-04-12 ENCOUNTER — Other Ambulatory Visit: Payer: Self-pay | Admitting: "Endocrinology

## 2021-04-12 MED ORDER — TESTOSTERONE 20.25 MG/ACT (1.62%) TD GEL: TRANSDERMAL | 0 refills | Status: DC

## 2021-04-12 NOTE — Telephone Encounter (Signed)
Insurance will not cover the Testosterone 1% (50mg ) Gel 5GM PK but they will cover the Pump for patient per Patty on PA line, ID# [id number]  Can you fill the Testosterone Pump instead for patient?

## 2021-04-13 ENCOUNTER — Encounter: Payer: Medicaid Other | Admitting: Physician Assistant

## 2021-04-13 ENCOUNTER — Ambulatory Visit
Admission: EM | Admit: 2021-04-13 | Discharge: 2021-04-13 | Disposition: A | Discharge status: Home / Self Care | Payer: Medicaid Other | Attending: Urgent Care | Admitting: Urgent Care

## 2021-04-13 ENCOUNTER — Other Ambulatory Visit: Payer: Self-pay

## 2021-04-13 ENCOUNTER — Encounter: Payer: Self-pay | Admitting: Emergency Medicine

## 2021-04-13 DIAGNOSIS — R21 Rash and other nonspecific skin eruption: Secondary | ICD-10-CM | POA: Diagnosis not present

## 2021-04-13 DIAGNOSIS — L299 Pruritus, unspecified: Secondary | ICD-10-CM

## 2021-04-13 MED ORDER — HYDROXYZINE HCL 25 MG PO TABS: 12.5000 mg | ORAL_TABLET | Freq: Three times a day (TID) | ORAL | 0 refills | Status: DC | PRN

## 2021-04-13 NOTE — ED Triage Notes (Signed)
Rash on left shoulder and left leg x 1 month.  States rash usually comes and goes, but now is consistent.  States rash itches.

## 2021-04-13 NOTE — Progress Notes (Signed)
The patient no-showed for appointment despite this provider sending direct link, reaching out via phone with no response and waiting for at least 10 minutes from appointment time for patient to join. They will be marked as a NS for this appointment/time.  ? ?Neil [name] Cody Terrie Haring, PA-C ? ? ? ?

## 2021-04-13 NOTE — ED Provider Notes (Signed)
[place]   MRN: [id number] DOB: [birth year]  Subjective:   Neil [name] is a 60 y.o. male presenting for 2 to 55-month history of persistent recurrent rash over different spots in his body.  It is primarily itchy.  Not painful or draining pus or bleeding.  He does switch up his soap products from time to time.  Does not use hypoallergenic products.  Same for his laundry detergent.  No facial or oral swelling, hives, chest tightness, vomiting.  Takes thyroid medication, also takes hydrocortisone as he has a history of a pituitary macroadenoma.  No current facility-administered medications for this encounter.  Current Outpatient Medications:    Cholecalciferol (VITAMIN D3) 125 MCG (5000 UT) CAPS, Take 1 capsule (5,000 Units total) by mouth daily., Disp: 90 capsule, Rfl: 1   hydrocortisone (CORTEF) 10 MG tablet, Take 10 mg by mouth everyday at 8AM and 5 mg everyday at 1PM, Disp: 150 tablet, Rfl: 1   hydrocortisone cream 1 %, Apply to affected area 2 times daily for 7 days., Disp: 20 g, Rfl: 0   Multiple Vitamin (MULTIVITAMIN WITH MINERALS) TABS tablet, Take 1 tablet by mouth daily., Disp: , Rfl:    Naphazoline HCl (CLEAR EYES OP), Apply 1 drop to eye daily as needed (dry eyes)., Disp: , Rfl:    naproxen (NAPROSYN) 250 MG tablet, Take 1 tablet (250 mg total) by mouth 2 (two) times daily as needed for mild pain or moderate pain (take with food)., Disp: 14 tablet, Rfl: 0   Testosterone 20.25 MG/ACT (1.62%) GEL, Apply 20.25mg /ACT on each shoulder every morning, Disp: 75 g, Rfl: 0   TIROSINT 100 MCG CAPS, Take 1 capsule (100 mcg total) by mouth daily before breakfast., Disp: 90 capsule, Rfl: 1   Allergies  Allergen Reactions   No Known Allergies     Past Medical History:  Diagnosis Date   Anemia 03/13/2017   Anxiety    Bipolar disorder [facility]) age 32 yo   Chest pain    Depression    Dyslipidemia, goal LDL below 100    Headache    Hypogonadism in male 03/13/2017    Hypothyroidism    Schizophrenia [facility]) age 77 yo   Shingles    Then states actually 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   Tobacco abuse      Past Surgical History:  Procedure Laterality Date   CRANIOTOMY N/A 12/07/2016   Procedure: TRANSPHENOIDAL RESECTION OF TUMOR;  Surgeon: Newman Pies, MD;  Location: [place];  Service: Neurosurgery;  Laterality: N/A;  TRANSPHENOIDAL RESECTION OF TUMOR   HEMORROIDECTOMY  2007   TRANSNASAL APPROACH N/A 12/07/2016   Procedure: TRANSNASAL APPROACH;  Surgeon: Rozetta Nunnery, MD;  Location: [facility] OR;  Service: ENT;  Laterality: N/A;    Family History  Problem Relation Age of Onset   Hypertension Mother    Stroke Mother    Mental retardation Mother    Heart failure Father    Hypertension Sister    Bipolar disorder Son    Schizophrenia Son    Cancer - Colon Neg Hx        Is pretty sure his dad did NOT have colon CA but not 100% sure   Gastric cancer Neg Hx    Esophageal cancer Neg Hx     Social History   Tobacco Use   Smoking status: Every Day    Packs/day: 0.50    Years: 40.00    Pack years:  20.00    Types: Cigarettes   Smokeless tobacco: Never  Vaping Use   Vaping Use: Never used  Substance Use Topics   Alcohol use: No    Alcohol/week: 0.0 standard drinks   Drug use: Not Currently    Types: Marijuana    Comment: denies use 01/16/18    ROS   Objective:   Vitals: BP 125/84 (BP Location: Right Arm)   Pulse 76   Temp 97.7 F (36.5 C) (Oral)   Resp 18   SpO2 98%   Physical Exam Constitutional:      General: He is not in acute distress.    Appearance: Normal appearance. He is well-developed and normal weight. He is not ill-appearing, toxic-appearing or diaphoretic.  HENT:     Head: Normocephalic and atraumatic.     Right Ear: External ear normal.     Left Ear: External ear normal.     Nose: Nose normal.     Mouth/Throat:     Pharynx: Oropharynx is clear.  Eyes:      General: No scleral icterus.       Right eye: No discharge.        Left eye: No discharge.     Extraocular Movements: Extraocular movements intact.     Pupils: Pupils are equal, round, and reactive to light.  Cardiovascular:     Rate and Rhythm: Normal rate.  Pulmonary:     Effort: Pulmonary effort is normal.  Musculoskeletal:     Cervical back: Normal range of motion.  Skin:    General: Skin is warm and dry.     Findings: Rash (Papular pruritic solitary lesion sometimes in clusters some associated with a dry scaly skin as depicted) present.  Neurological:     Mental Status: He is alert and oriented to person, place, and time.  Psychiatric:        Mood and Affect: Mood normal.        Behavior: Behavior normal.        Thought Content: Thought content normal.        Judgment: Judgment normal.           Assessment and Plan :   PDMP not reviewed this encounter.  1. Rash and nonspecific skin eruption   2. Itching    Suspect an irritant dermatitis and recommended he switch his products to hypoallergenic soaps and laundry detergents.  Maintain hydrocortisone.  Use hydroxyzine.  Follow-up with dermatology given the recurrent nature of his rash. Counseled patient on potential for adverse effects with medications prescribed/recommended today, ER and return-to-clinic precautions discussed, patient verbalized understanding.    Jaynee Eagles, PA-C 04/13/21 [id number]

## 2021-05-09 ENCOUNTER — Other Ambulatory Visit: Payer: Self-pay | Admitting: "Endocrinology

## 2021-05-10 ENCOUNTER — Other Ambulatory Visit: Payer: Self-pay | Admitting: "Endocrinology

## 2021-05-12 ENCOUNTER — Other Ambulatory Visit: Payer: Self-pay | Admitting: "Endocrinology

## 2021-05-17 ENCOUNTER — Telehealth: Payer: Self-pay | Admitting: "Endocrinology

## 2021-05-17 ENCOUNTER — Other Ambulatory Visit: Payer: Self-pay | Admitting: "Endocrinology

## 2021-05-17 MED ORDER — TESTOSTERONE 20.25 MG/ACT (1.62%) TD GEL: TRANSDERMAL | 0 refills | Status: DC

## 2021-05-17 NOTE — Telephone Encounter (Signed)
Pt is calling and requesting a refill   Testosterone 20.25 MG/ACT (1.62%) GEL

## 2021-05-23 ENCOUNTER — Telehealth: Payer: Self-pay

## 2021-05-23 MED ORDER — TIROSINT 100 MCG PO CAPS: 100.0000 ug | ORAL_CAPSULE | Freq: Every day | ORAL | 1 refills | Status: DC

## 2021-05-26 ENCOUNTER — Other Ambulatory Visit: Payer: Self-pay | Admitting: "Endocrinology

## 2021-05-26 NOTE — Telephone Encounter (Signed)
Have you started a PA?

## 2021-05-26 NOTE — Telephone Encounter (Signed)
I have not seen a PA for this. I worked on his testosterone recently.

## 2021-05-26 NOTE — Telephone Encounter (Signed)
Pt is calling to check the status of his PA for his levothyroxine.

## 2021-05-27 NOTE — Telephone Encounter (Signed)
Last OV 01/27/2021  Next OV 08/03/2021  Okay to continue the Vitamin D 5000 IU?

## 2021-05-27 NOTE — Telephone Encounter (Signed)
Pt is calling you back in regards to his medication

## 2021-05-27 NOTE — Telephone Encounter (Signed)
Left a message requesting a return call to the office. 

## 2021-05-27 NOTE — Telephone Encounter (Signed)
Discussed with pt we are sending in information to Medicaid regarding a prior authorization needed for Tirosint. Pt voiced understanding.

## 2021-06-09 ENCOUNTER — Encounter: Payer: Self-pay | Admitting: Gastroenterology

## 2021-06-09 NOTE — Progress Notes (Signed)
Primary Care Physician:  [name], MD Primary Gastroenterologist:  Dr. Abbey Chatters  Chief Complaint  Patient presents with   Constipation    HPI:   Neil [name] is a 60 y.o. male with history of constipation, hyperplastic colon polyps with last colonoscopy in July 2013 due for surveillance in 2023, presenting today with chief complaint of constipation.  Last seen in our office 02/26/2018.    Today: Chronic issues with constipation.  He is eating prunes and cereal to help with bowel regularity.  Currently with bowel movements 1-2 times daily, but stools are incomplete and require straining at times.  Stool consistency fluctuates between hard and soft.  Denies abdominal pain, BRBPR, melena, unintentional weight loss.  Drinks plenty of water daily, but does not consume any fruits and vegetables.  No upper GI symptoms.  Denies heartburn, nausea, vomiting, dysphagia.  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   Tobacco abuse     Past Surgical History:  Procedure Laterality Date   COLONOSCOPY  12/2011   Ascending and sigmoid diverticulosis, 2 polyps in rectosigmoid colon, possible polyp in rectum biopsied internal hemorrhoids, normal TI.  Pathology revealed hyperplastic polyps.  Recommended 10-year repeat.   CRANIOTOMY N/A 12/07/2016   Procedure: TRANSPHENOIDAL RESECTION OF TUMOR;  Surgeon: Newman Pies, MD;  Location: [facility];  Service: Neurosurgery;  Laterality: N/A;  TRANSPHENOIDAL RESECTION OF TUMOR   HEMORROIDECTOMY  2007   TRANSNASAL APPROACH N/A 12/07/2016   Procedure:  TRANSNASAL APPROACH;  Surgeon: Rozetta Nunnery, MD;  Location: [facility] OR;  Service: ENT;  Laterality: N/A;    Current Outpatient Medications  Medication Sig Dispense Refill   Cholecalciferol (VITAMIN D3) 125 MCG (5000 UT) CAPS TAKE 1 CAPSULE BY MOUTH DAILY 90 capsule 1   hydrocortisone (CORTEF) 10 MG tablet Take 10 mg by mouth everyday at 8AM and 5 mg everyday at 1PM (Patient taking differently: Take 10 mg by mouth daily.) 150 tablet 1   hydrocortisone cream 1 % Apply to affected area 2 times daily for 7 days. 20 g 0   hydrOXYzine (ATARAX/VISTARIL) 25 MG tablet Take 0.5-1 tablets (12.5-25 mg total) by mouth every 8 (eight) hours as needed for itching. 30 tablet 0   Multiple Vitamin (MULTIVITAMIN WITH MINERALS) TABS tablet Take 1 tablet by mouth daily.     Naphazoline HCl (CLEAR EYES OP) Apply 1 drop to eye daily as needed (dry eyes).     naproxen (NAPROSYN) 250 MG tablet Take 1 tablet (250 mg total) by mouth 2 (two) times daily as needed for mild pain or moderate pain (take with food). 14 tablet 0   Testosterone 20.25 MG/ACT (1.62%) GEL Apply 20.25mg /ACT on each shoulder every morning 75 g 0   TIROSINT 100 MCG CAPS Take 1 capsule (100 mcg total) by mouth daily before breakfast. 90 capsule 1   No current facility-administered medications for this visit.    Allergies as of 06/10/2021 - Review Complete 06/10/2021  Allergen Reaction Noted   No known  allergies  12/06/2016    Family History  Problem Relation Age of Onset   Hypertension Mother    Stroke Mother    Mental retardation Mother    Heart failure Father    Hypertension Sister    Bipolar disorder Son    Schizophrenia Son    Cancer - Colon Neg Hx        Is pretty sure his dad did NOT have colon CA but not 100% sure   Gastric cancer Neg Hx    Esophageal cancer Neg Hx     Social History   Socioeconomic History   Marital status: Single    Spouse name: Not on file   Number of children: 2   Years of education: 12   Highest  education level: Not on file  Occupational History   Occupation: unemployed carpentry/upholstery  Tobacco Use   Smoking status: Every Day    Packs/day: 0.50    Years: 40.00    Pack years: 20.00    Types: Cigarettes   Smokeless tobacco: Never  Vaping Use   Vaping Use: Never used  Substance and Sexual Activity   Alcohol use: No    Alcohol/week: 0.0 standard drinks   Drug use: Not Currently    Types: Marijuana    Comment: denies use 01/16/18   Sexual activity: Not Currently    Birth control/protection: None  Other Topics Concern   Not on file  Social History Narrative   Born in [place], [place]   Has lived in [place] for years.   Currently lives with maternal uncle, who also smokes, but is trying to get his own place.     Working on disability.   Social Determinants of Health   Financial Resource Strain: Not on file  Food Insecurity: Not on file  Transportation Needs: Not on file  Physical Activity: Not on file  Stress: Not on file  Social Connections: Not on file  Intimate Partner Violence: Not on file    Review of Systems: Gen: Denies any fever, chills, cold or flulike symptoms, presyncope, syncope. CV: Denies chest pain, heart palpitations. Resp: Denies shortness of breath or cough GI: See HPI GU : Denies urinary burning, urinary frequency, urinary hesitancy Heme: See HPI  Physical Exam: BP 121/80    Pulse 62    Temp (!) 97.1 F (36.2 C) (Temporal)    Ht 5\' 8"  (1.727 m)    Wt 163 lb 6.4 oz (74.1 kg)    BMI 24.84 kg/m  General:   Alert and oriented. Pleasant and cooperative. Well-nourished and well-developed.  Head:  Normocephalic and atraumatic. Eyes:  Without icterus, sclera clear and conjunctiva pink.  Ears:  Normal auditory acuity. Lungs:  Clear to auscultation bilaterally. No wheezes, rales, or rhonchi. No distress.  Heart:  S1, S2 present without murmurs appreciated.  Abdomen:  +BS, soft, non-tender and non-distended. No HSM noted. No guarding or rebound.  No masses appreciated.  Rectal:  Deferred  Msk:  Symmetrical without gross deformities. Normal posture. Extremities:  Without edema. Neurologic:  Alert and  oriented x4;  grossly normal neurologically. Skin:  Intact without significant lesions or rashes. Psych:  Normal mood and affect.    Assessment: 60 year old male presenting today for further evaluation of chronic constipation.  Clinically, his symptoms are mild.  He is currently managing with prunes and cereal which is allowing him to have 1-2 BMs daily, but continues with incomplete bowel movements, occasional straining and hard stools.  Admits to little dietary fiber intake.  No alarm symptoms.  Colonoscopy up-to-date in July 2013 with hyperplastic polyps, due for repeat in 2023.   Plan:  Increase dietary fiber intake through fruits and vegetables. Start Benefiber 2 teaspoons daily x2 weeks and increase to twice daily as tolerated. If Benefiber alone does not improve bowel regularity, add MiraLAX 1 capful (17 g) daily in 8 ounces of water. Requested patient call back if Benefiber and MiraLAX are not adequately managing his constipation and we can try a prescriptive agent. Follow-up in 6 months.  We will discuss scheduling a colonoscopy at that time.   Aliene Altes, PA-C [facility] 06/10/2021

## 2021-06-10 ENCOUNTER — Ambulatory Visit: Payer: Medicaid Other | Admitting: Gastroenterology

## 2021-06-10 ENCOUNTER — Other Ambulatory Visit: Payer: Self-pay

## 2021-06-10 ENCOUNTER — Encounter: Payer: Self-pay | Admitting: Gastroenterology

## 2021-06-10 VITALS — BP 121/80 | HR 62 | Temp 97.1°F | Ht 68.0 in | Wt 163.4 lb

## 2021-06-10 DIAGNOSIS — K5900 Constipation, unspecified: Secondary | ICD-10-CM

## 2021-06-10 NOTE — Patient Instructions (Signed)
For constipation: Start Benefiber 2 teaspoons daily x2 weeks then increase to twice daily as tolerated. Trying increasing your daily fiber intake through fruits and vegetables as well.  If Benefiber alone does not help with your bowel regularity, you may try using MiraLAX 1 capful (17 g) daily in 8 ounces of water. If the above recommendations do not manage your constipation well, please let me know and we can try a different medication for you.  We will plan to see you back in the office in about 6 months.  We will also discuss scheduling you for a colonoscopy at that time.  It was good to see you today!  I hope you have a very happy new year!  Aliene Altes, PA-C [facility]

## 2021-06-28 ENCOUNTER — Telehealth: Payer: Self-pay | Admitting: "Endocrinology

## 2021-06-28 ENCOUNTER — Other Ambulatory Visit: Payer: Self-pay | Admitting: "Endocrinology

## 2021-06-28 NOTE — Telephone Encounter (Signed)
Pt is requesting a refill on testosterone   [id number] - [place], [place] - [address] Phone:  [phone]  Fax:  [phone]

## 2021-06-29 NOTE — Telephone Encounter (Signed)
Tried to contact pt. No answer

## 2021-07-18 ENCOUNTER — Other Ambulatory Visit: Payer: Self-pay | Admitting: "Endocrinology

## 2021-07-18 ENCOUNTER — Telehealth: Payer: Self-pay | Admitting: "Endocrinology

## 2021-07-18 NOTE — Telephone Encounter (Signed)
Pt states he will just wait until his appt on Feb 22nd.

## 2021-07-18 NOTE — Telephone Encounter (Signed)
Requesting refill  Testosterone 20.25 MG/ACT (1.62%) GEL  [id number] - [place],  - [address] AT [place] [address] Phone:  [phone]  Fax:  [phone]

## 2021-07-20 ENCOUNTER — Emergency Department (HOSPITAL_COMMUNITY)
Admission: EM | Admit: 2021-07-20 | Discharge: 2021-07-20 | Discharge status: Against Medical Advice | Payer: Medicaid Other | Attending: Emergency Medicine | Admitting: Emergency Medicine

## 2021-07-20 ENCOUNTER — Other Ambulatory Visit: Payer: Self-pay

## 2021-07-20 DIAGNOSIS — Z20822 Contact with and (suspected) exposure to covid-19: Secondary | ICD-10-CM | POA: Diagnosis not present

## 2021-07-20 DIAGNOSIS — Z5321 Procedure and treatment not carried out due to patient leaving prior to being seen by health care provider: Secondary | ICD-10-CM | POA: Insufficient documentation

## 2021-07-20 DIAGNOSIS — R519 Headache, unspecified: Secondary | ICD-10-CM | POA: Diagnosis not present

## 2021-07-20 DIAGNOSIS — R509 Fever, unspecified: Secondary | ICD-10-CM | POA: Insufficient documentation

## 2021-07-20 LAB — RESP PANEL BY RT-PCR (FLU A&B, COVID) ARPGX2
Influenza A by PCR: NEGATIVE
Influenza B by PCR: NEGATIVE
SARS Coronavirus 2 by RT PCR: POSITIVE — AB

## 2021-07-20 MED ORDER — ACETAMINOPHEN 325 MG PO TABS
650.0000 mg | ORAL_TABLET | Freq: Once | ORAL | Status: AC
  Administered 2021-07-20: 650 mg via ORAL
  Filled 2021-07-20: qty 2

## 2021-07-20 NOTE — ED Triage Notes (Signed)
Pt with headaches and fever for past few days. Recent sick contact, unknown of Covid.

## 2021-07-23 LAB — COMPREHENSIVE METABOLIC PANEL
ALT: 17 IU/L (ref 0–44)
AST: 20 IU/L (ref 0–40)
Albumin/Globulin Ratio: 2.6 — ABNORMAL HIGH (ref 1.2–2.2)
Albumin: 5 g/dL — ABNORMAL HIGH (ref 3.8–4.8)
Alkaline Phosphatase: 58 IU/L (ref 44–121)
BUN/Creatinine Ratio: 9 — ABNORMAL LOW (ref 10–24)
BUN: 13 mg/dL (ref 8–27)
Bilirubin Total: 0.4 mg/dL (ref 0.0–1.2)
CO2: 25 mmol/L (ref 20–29)
Calcium: 10.1 mg/dL (ref 8.6–10.2)
Chloride: 102 mmol/L (ref 96–106)
Creatinine, Ser: 1.43 mg/dL — ABNORMAL HIGH (ref 0.76–1.27)
Globulin, Total: 1.9 g/dL (ref 1.5–4.5)
Glucose: 89 mg/dL (ref 70–99)
Potassium: 4.4 mmol/L (ref 3.5–5.2)
Sodium: 139 mmol/L (ref 134–144)
Total Protein: 6.9 g/dL (ref 6.0–8.5)
eGFR: 56 mL/min/{1.73_m2} — ABNORMAL LOW (ref >59)

## 2021-07-23 LAB — T4, FREE: Free T4: 1.45 ng/dL (ref 0.82–1.77)

## 2021-07-23 LAB — TESTOSTERONE, FREE, TOTAL, SHBG
Sex Hormone Binding: 40.6 nmol/L (ref 19.3–76.4)
Testosterone, Free: 1.1 pg/mL — ABNORMAL LOW (ref 6.6–18.1)
Testosterone: 177 ng/dL — ABNORMAL LOW (ref 264–916)

## 2021-07-26 ENCOUNTER — Ambulatory Visit
Admission: EM | Admit: 2021-07-26 | Discharge: 2021-07-26 | Disposition: A | Discharge status: Home / Self Care | Payer: Medicaid Other | Attending: Student | Admitting: Student

## 2021-07-26 ENCOUNTER — Other Ambulatory Visit: Payer: Self-pay

## 2021-07-26 DIAGNOSIS — R051 Acute cough: Secondary | ICD-10-CM

## 2021-07-26 DIAGNOSIS — F17200 Nicotine dependence, unspecified, uncomplicated: Secondary | ICD-10-CM

## 2021-07-26 DIAGNOSIS — U071 COVID-19: Secondary | ICD-10-CM

## 2021-07-26 MED ORDER — AMOXICILLIN-POT CLAVULANATE 875-125 MG PO TABS: 1.0000 | ORAL_TABLET | Freq: Two times a day (BID) | ORAL | 0 refills | Status: DC

## 2021-07-26 MED ORDER — ALBUTEROL SULFATE HFA 108 (90 BASE) MCG/ACT IN AERS: 1.0000 | INHALATION_SPRAY | Freq: Four times a day (QID) | RESPIRATORY_TRACT | 0 refills | Status: AC | PRN

## 2021-07-26 NOTE — Discharge Instructions (Addendum)
-  Start the antibiotic-Augmentin (amoxicillin-clavulanate), 1 pill every 12 hours for 7 days.  You can take this with food like with breakfast and dinner. -Albuterol inhaler as needed for cough, wheezing, shortness of breath, 1 to 2 puffs every 6 hours as needed.

## 2021-07-26 NOTE — ED Provider Notes (Signed)
[facility]    CSN: [id number] Arrival date & time: 07/26/21  0820      History   Chief Complaint Chief Complaint  Patient presents with   Covid Positive    HPI Neil [name] is a 61 y.o. male presenting with lingering cough for 1 week following COVID-19.  Medical history tobacco abuse.  Describes cough productive of brown sputum.  This is not associated with dyspnea, chest pain, fever/chills.  Has not tried interventions at home.  Does not use current inhalers.  No formal diagnosis of COPD.  HPI  Past Medical History:  Diagnosis Date   Adrenal insufficiency ([place])    Anemia 03/13/2017   Anxiety    Bipolar disorder [facility]) age 59 yo   Chest pain    Depression    Dyslipidemia, goal LDL below 100    Headache    Hypogonadism in male 03/13/2017   Hypothyroidism    Panhypopituitarism ([place])    Schizophrenia 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  Pathology revealed hyperplastic polyps.  Recommended 10-year repeat.   CRANIOTOMY N/A 12/07/2016   Procedure: TRANSPHENOIDAL RESECTION OF TUMOR;  Surgeon: Newman Pies, MD;  Location: [facility];  Service: Neurosurgery;  Laterality: N/A;  TRANSPHENOIDAL RESECTION OF TUMOR   HEMORROIDECTOMY  2007   TRANSNASAL APPROACH N/A 12/07/2016   Procedure: TRANSNASAL APPROACH;  Surgeon: Rozetta Nunnery, MD;  Location: [place];  Service: ENT;  Laterality: N/A;       Home Medications    Prior to Admission medications   Medication Sig Start Date End Date Taking? Authorizing Provider  albuterol (VENTOLIN HFA) 108 (90 Base) MCG/ACT inhaler Inhale 1-2 puffs into the lungs every 6 (six) hours as needed for wheezing or shortness of breath. 07/26/21  Yes Hazel Sams, PA-C  amoxicillin-clavulanate (AUGMENTIN) 875-125 MG tablet Take 1 tablet by mouth every 12 (twelve) hours. 07/26/21  Yes Hazel Sams, PA-C  Cholecalciferol (VITAMIN D3) 125 MCG (5000 UT) CAPS TAKE 1 CAPSULE BY MOUTH DAILY 05/27/21   Cassandria Anger, MD  hydrocortisone (CORTEF) 10 MG tablet Take 10 mg by mouth everyday at 8AM and 5 mg everyday at 1PM Patient taking differently: Take 10 mg by mouth daily. 03/28/21   Cassandria Anger, MD  hydrocortisone cream 1 % Apply to affected area 2 times daily for 7 days. 04/11/18   Elayne Snare, MD  hydrOXYzine (ATARAX/VISTARIL) 25 MG tablet Take 0.5-1 tablets (12.5-25 mg total) by mouth every 8 (eight) hours as needed for itching. 04/13/21   Jaynee Eagles, PA-C  Multiple Vitamin (MULTIVITAMIN WITH MINERALS) TABS tablet Take 1 tablet by mouth daily.    [provider]  Naphazoline HCl (CLEAR EYES OP) Apply 1 drop to eye daily as needed (dry eyes).    [provider]  naproxen (NAPROSYN) 250 MG tablet Take 1 tablet (250 mg total) by mouth 2 (two) times daily as needed for mild pain  or moderate pain (take with food). 03/23/18   Francine Graven, DO  Testosterone 20.25 MG/ACT (1.62%) GEL Apply 20.25mg /ACT on each shoulder every morning 05/17/21   Cassandria Anger, MD  TIROSINT 100 MCG CAPS Take 1 capsule (100 mcg total) by mouth daily before breakfast. 05/23/21   Nida, Marella Chimes, MD    Family History Family History  Problem Relation Age of Onset   Hypertension Mother    Stroke Mother    Mental retardation Mother    Heart failure Father    Hypertension Sister    Bipolar disorder Son    Schizophrenia Son    Cancer - Colon Neg Hx        Is pretty sure his dad did NOT have colon CA but not 100% sure   Gastric cancer Neg Hx    Esophageal cancer Neg Hx     Social History Social History   Tobacco Use   Smoking status: Every Day    Packs/day: 0.50    Years: 40.00    Pack years: 20.00    Types: Cigarettes   Smokeless tobacco: Never  Vaping Use   Vaping Use: Never used  Substance Use Topics   Alcohol use: No    Alcohol/week: 0.0 standard drinks   Drug use: Not Currently    Types: Marijuana    Comment: denies use 01/16/18     Allergies   No known allergies   Review of Systems Review of Systems  Constitutional:  Negative for appetite change, chills and fever.  HENT:  Negative for congestion, ear pain, rhinorrhea, sinus pressure, sinus pain and sore throat.   Eyes:  Negative for redness and visual disturbance.  Respiratory:  Positive for cough. Negative for chest tightness, shortness of breath and wheezing.   Cardiovascular:  Negative for chest pain and palpitations.  Gastrointestinal:  Negative for abdominal pain, constipation, diarrhea, nausea and vomiting.  Genitourinary:  Negative for dysuria, frequency and urgency.  Musculoskeletal:  Negative for myalgias.  Neurological:  Negative for dizziness, weakness and headaches.  Psychiatric/Behavioral:  Negative for confusion.   All other systems reviewed and are negative.   Physical  Exam Triage Vital Signs ED Triage Vitals  Enc Vitals Group     BP 07/26/21 0908 119/81     Pulse Rate 07/26/21 0908 65     Resp 07/26/21 0908 20     Temp 07/26/21 0908 98.1 F (36.7 C)     Temp src --      SpO2 07/26/21 0908 98 %     Weight --      Height --      Head Circumference --      Peak Flow --      Pain Score 07/26/21 0907 0     Pain Loc --      Pain Edu? --      Excl. in [place]? --    No data found.  Updated Vital Signs BP 119/81  Pulse 65    Temp 98.1 F (36.7 C)    Resp 20    SpO2 98%   Visual Acuity Right Eye Distance:   Left Eye Distance:   Bilateral Distance:    Right Eye Near:   Left Eye Near:    Bilateral Near:     Physical Exam Vitals reviewed.  Constitutional:      General: He is not in acute distress.    Appearance: Normal appearance. He is not ill-appearing.  HENT:     Head: Normocephalic and atraumatic.     Right Ear: Tympanic membrane, ear canal and external ear normal. No tenderness. No middle ear effusion. There is no impacted cerumen. Tympanic membrane is not perforated, erythematous, retracted or bulging.     Left Ear: Tympanic membrane, ear canal and external ear normal. No tenderness.  No middle ear effusion. There is no impacted cerumen. Tympanic membrane is not perforated, erythematous, retracted or bulging.     Nose: Nose normal. No congestion.     Mouth/Throat:     Mouth: Mucous membranes are moist.     Pharynx: Uvula midline. No oropharyngeal exudate or posterior oropharyngeal erythema.  Eyes:     Extraocular Movements: Extraocular movements intact.     Pupils: Pupils are equal, round, and reactive to light.  Cardiovascular:     Rate and Rhythm: Normal rate and regular rhythm.     Heart sounds: Normal heart sounds.  Pulmonary:     Effort: Pulmonary effort is normal.     Breath sounds: Rhonchi present. No decreased breath sounds, wheezing or rales.     Comments: Faint rhonchi throughout Abdominal:     Palpations: Abdomen is  soft.     Tenderness: There is no abdominal tenderness. There is no guarding or rebound.  Lymphadenopathy:     Cervical: No cervical adenopathy.     Right cervical: No superficial cervical adenopathy.    Left cervical: No superficial cervical adenopathy.  Neurological:     General: No focal deficit present.     Mental Status: He is alert and oriented to person, place, and time.  Psychiatric:        Mood and Affect: Mood normal.        Behavior: Behavior normal.        Thought Content: Thought content normal.        Judgment: Judgment normal.     UC Treatments / Results  Labs (all labs ordered are listed, but only abnormal results are displayed) Labs Reviewed - No data to display  EKG   Radiology No results found.  Procedures Procedures (including critical care time)  Medications Ordered in UC Medications - No data to display  Initial Impression / Assessment and Plan / UC Course  I have reviewed the triage vital signs and the nursing notes.  Pertinent labs & imaging results that were available during my care of the patient were reviewed by me and considered in my medical decision making (see chart for details).     This patient is a very pleasant 61 y.o. year old male presenting with COPD exacerbation following covid-19. Afebrile, nontachy. Rhonchi present, but no focal consolidation; low suspicion for pneumonia.  Current every day smoker. Will manage as COPD exacerbation with augmentin and albuterol inhaler.   ED return precautions discussed. Patient verbalizes understanding and agreement.   Coding Level 4 for acute exacerbation of chronic condition, and prescription drug management  Final Clinical Impressions(s) / UC Diagnoses   Final diagnoses:  COVID-19  Current smoker  Acute cough     Discharge Instructions      -Start the antibiotic-Augmentin (amoxicillin-clavulanate), 1 pill every 12 hours for 7 days.  You can take this with food like with breakfast  and dinner. -Albuterol inhaler as needed for cough, wheezing, shortness of breath, 1 to 2 puffs every 6 hours as needed.     ED Prescriptions     Medication Sig Dispense Auth. Provider   albuterol (VENTOLIN HFA) 108 (90 Base) MCG/ACT inhaler Inhale 1-2 puffs into the lungs every 6 (six) hours as needed for wheezing or shortness of breath. 1 each Hazel Sams, PA-C   amoxicillin-clavulanate (AUGMENTIN) 875-125 MG tablet Take 1 tablet by mouth every 12 (twelve) hours. 14 tablet Hazel Sams, PA-C      PDMP not reviewed this encounter.   Hazel Sams, PA-C 07/26/21 [phone]

## 2021-07-26 NOTE — ED Triage Notes (Signed)
Pt presents with cough, tested positive for covid last week

## 2021-08-01 ENCOUNTER — Telehealth: Payer: Self-pay | Admitting: "Endocrinology

## 2021-08-01 NOTE — Telephone Encounter (Signed)
Patient is requesting a refill on Testosterone 20.25 MG/ACT (1.62%) GEL..   [place]

## 2021-08-01 NOTE — Telephone Encounter (Signed)
Discussed with pt, understanding voiced. 

## 2021-08-03 ENCOUNTER — Ambulatory Visit: Payer: Medicaid Other | Admitting: "Endocrinology

## 2021-08-05 ENCOUNTER — Ambulatory Visit (INDEPENDENT_AMBULATORY_CARE_PROVIDER_SITE_OTHER): Payer: Medicaid Other | Admitting: "Endocrinology

## 2021-08-05 ENCOUNTER — Encounter: Payer: Self-pay | Admitting: "Endocrinology

## 2021-08-05 ENCOUNTER — Other Ambulatory Visit: Payer: Self-pay

## 2021-08-05 VITALS — BP 114/78 | HR 64 | Ht 68.0 in | Wt 159.6 lb

## 2021-08-05 DIAGNOSIS — D352 Benign neoplasm of pituitary gland: Secondary | ICD-10-CM | POA: Diagnosis not present

## 2021-08-05 DIAGNOSIS — E291 Testicular hypofunction: Secondary | ICD-10-CM | POA: Diagnosis not present

## 2021-08-05 DIAGNOSIS — E230 Hypopituitarism: Secondary | ICD-10-CM

## 2021-08-05 DIAGNOSIS — E2740 Unspecified adrenocortical insufficiency: Secondary | ICD-10-CM | POA: Diagnosis not present

## 2021-08-05 MED ORDER — TESTOSTERONE 20.25 MG/ACT (1.62%) TD GEL: TRANSDERMAL | 2 refills | Status: DC

## 2021-08-05 MED ORDER — HYDROCORTISONE 10 MG PO TABS: ORAL_TABLET | ORAL | 1 refills | Status: DC

## 2021-08-05 MED ORDER — TIROSINT 100 MCG PO CAPS: 100.0000 ug | ORAL_CAPSULE | Freq: Every day | ORAL | 1 refills | Status: DC
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
## 2021-10-27 ENCOUNTER — Encounter: Payer: Self-pay | Admitting: Internal Medicine

## 2021-10-27 LAB — CBC WITH DIFFERENTIAL/PLATELET
Basophils Absolute: 0 10*3/uL (ref 0.0–0.2)
Basos: 0 %
EOS (ABSOLUTE): 0.2 10*3/uL (ref 0.0–0.4)
Eos: 4 %
Hematocrit: 39.6 % (ref 37.5–51.0)
Hemoglobin: 13.4 g/dL (ref 13.0–17.7)
Immature Grans (Abs): 0 10*3/uL (ref 0.0–0.1)
Immature Granulocytes: 0 %
Lymphocytes Absolute: 3.6 10*3/uL — ABNORMAL HIGH (ref 0.7–3.1)
Lymphs: 66 %
MCH: 30.9 pg (ref 26.6–33.0)
MCHC: 33.8 g/dL (ref 31.5–35.7)
MCV: 92 fL (ref 79–97)
Monocytes Absolute: 0.4 10*3/uL (ref 0.1–0.9)
Monocytes: 7 %
Neutrophils Absolute: 1.2 10*3/uL — ABNORMAL LOW (ref 1.4–7.0)
Neutrophils: 23 %
Platelets: 361 10*3/uL (ref 150–450)
RBC: 4.33 x10E6/uL (ref 4.14–5.80)
RDW: 13.6 % (ref 11.6–15.4)
WBC: 5.5 10*3/uL (ref 3.4–10.8)

## 2021-10-27 LAB — TESTOSTERONE, FREE, TOTAL, SHBG
Sex Hormone Binding: 63 nmol/L (ref 19.3–76.4)
Testosterone, Free: 1.6 pg/mL — ABNORMAL LOW (ref 6.6–18.1)
Testosterone: 180 ng/dL — ABNORMAL LOW (ref 264–916)

## 2021-10-27 LAB — T4, FREE: Free T4: 1.75 ng/dL (ref 0.82–1.77)

## 2021-11-02 ENCOUNTER — Encounter: Payer: Self-pay | Admitting: "Endocrinology

## 2021-11-02 ENCOUNTER — Ambulatory Visit (INDEPENDENT_AMBULATORY_CARE_PROVIDER_SITE_OTHER): Payer: Medicaid Other | Admitting: "Endocrinology

## 2021-11-02 VITALS — BP 114/78 | HR 60 | Ht 68.0 in | Wt 159.2 lb

## 2021-11-02 DIAGNOSIS — E230 Hypopituitarism: Secondary | ICD-10-CM

## 2021-11-02 DIAGNOSIS — E291 Testicular hypofunction: Secondary | ICD-10-CM

## 2021-11-02 DIAGNOSIS — E2740 Unspecified adrenocortical insufficiency: Secondary | ICD-10-CM

## 2021-11-02 DIAGNOSIS — F17200 Nicotine dependence, unspecified, uncomplicated: Secondary | ICD-10-CM
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
## 2021-11-10 ENCOUNTER — Telehealth: Payer: Self-pay | Admitting: "Endocrinology

## 2021-11-10 NOTE — Telephone Encounter (Signed)
Patient is asking for a refill on his Testosterone 20.25 MG/ACT (1.62%) GEL Please send to the [facility] on [address] this time because it is out of stock at [facility]

## 2021-11-11 ENCOUNTER — Other Ambulatory Visit: Payer: Self-pay | Admitting: "Endocrinology

## 2021-11-11 DIAGNOSIS — Z515 Encounter for palliative care: Secondary | ICD-10-CM

## 2021-11-11 MED ORDER — TESTOSTERONE 20.25 MG/ACT (1.62%) TD GEL: TRANSDERMAL | 2 refills | Status: DC

## 2021-11-11 NOTE — Telephone Encounter (Signed)
Pt is calling back, can this be sent in ?

## 2021-11-22 ENCOUNTER — Other Ambulatory Visit: Payer: Self-pay | Admitting: "Endocrinology

## 2021-11-22 MED ORDER — TESTOSTERONE 20.25 MG/ACT (1.62%) TD GEL: TRANSDERMAL | 1 refills | Status: DC

## 2021-11-22 NOTE — Telephone Encounter (Signed)
Pt called and said [facility] on [address] told him they can not get the Testosterone Gel, he is needing it sent to [facility] [address] in [place]  Testosterone 20.25 MG/ACT (1.62%) GEL   [id number] - [place], [place]

## 2021-11-30 ENCOUNTER — Ambulatory Visit
Admission: EM | Admit: 2021-11-30 | Discharge: 2021-11-30 | Disposition: A | Discharge status: Home / Self Care | Payer: Medicaid Other | Attending: Nurse Practitioner | Admitting: Nurse Practitioner

## 2021-11-30 ENCOUNTER — Ambulatory Visit (INDEPENDENT_AMBULATORY_CARE_PROVIDER_SITE_OTHER): Payer: Medicaid Other

## 2021-11-30 DIAGNOSIS — M545 Low back pain, unspecified: Secondary | ICD-10-CM

## 2021-11-30 DIAGNOSIS — M5442 Lumbago with sciatica, left side: Secondary | ICD-10-CM

## 2021-11-30 MED ORDER — DICLOFENAC SODIUM 1 % EX GEL: 2.0000 g | Freq: Four times a day (QID) | CUTANEOUS | 0 refills | Status: DC

## 2021-11-30 NOTE — Discharge Instructions (Addendum)
-   The back x-ray today does not show any acute findings, there are some age-related changes in the discs in your spine -You can also try the back exercises attached as well as heat/ice and the NSAID gel  -Please follow-up with your pain management doctor if the pain persists without improvement with the treatment

## 2021-11-30 NOTE — ED Provider Notes (Signed)
[facility]    CSN: [id number] Arrival date & time: 11/30/21  1353      History   Chief Complaint Chief Complaint  Patient presents with   Back Pain   Leg Pain    HPI Neil [name] is a 61 y.o. male.   Patient presents for acute on chronic back pain that has been going on and worsening for the past few months.  Denies any recent accident, injury, or trauma to the back.  No falls.  Reports the pain is severe and describes the pain as a sharp shooting down his left leg to his toes.  He reports some numbness and tingling going down his leg.  He takes "Oxys" for his chronic back pain, has not tried anything else to help with the pain.  Denies saddle anesthesia, bowel/bladder incontinence, fevers, nausea/vomiting, dysuria/urinary frequency.   Medical history significant for hyperlipidemia, schizophrenia, lumbar radiculopathy, pituitary microadenoma, hypogonadism, panhypopituitarism, adrenal insufficiency, and hypothyroidism.  Patient follows closely with an endocrinologist, reports compliance with medications.    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[place]) 12/07/2016   Pituitary macroadenoma ([place]) 08/21/2016   Current smoker    Dyslipidemia, goal LDL below 100    Sebaceous cyst of left axilla 06/08/2015   Boil of trunk 06/08/2015   Healthcare maintenance 06/08/2015   Lumbar radiculopathy 03/16/2015   Urinary hesitancy 03/16/2015   Hyperlipidemia 03/05/2015   Chest pain 03/05/2015   Depression 11/02/2014   Schizophrenia ([place]) 11/02/2014    Past Surgical History:  Procedure Laterality Date   COLONOSCOPY  12/2011   Ascending and sigmoid diverticulosis, 2 polyps in rectosigmoid colon, possible polyp in rectum biopsied internal hemorrhoids, normal TI.  Pathology revealed hyperplastic polyps.  Recommended 10-year repeat.   CRANIOTOMY N/A 12/07/2016   Procedure: TRANSPHENOIDAL RESECTION OF TUMOR;  Surgeon: Newman Pies, MD;  Location: [place];  Service: Neurosurgery;  Laterality: N/A;  TRANSPHENOIDAL RESECTION OF TUMOR   HEMORROIDECTOMY  2007   TRANSNASAL APPROACH N/A 12/07/2016   Procedure: TRANSNASAL APPROACH;  Surgeon: Rozetta Nunnery, MD;  Location: [place];  Service: ENT;  Laterality: N/A;       Home Medications    Prior to Admission medications   Medication Sig Start Date End Date Taking? Authorizing Provider  diclofenac Sodium (VOLTAREN) 1 % GEL Apply 2 g topically 4 (four) times daily. 11/30/21  Yes Eulogio Bear, NP  albuterol (VENTOLIN HFA) 108 (90 Base) MCG/ACT inhaler Inhale 1-2 puffs into the lungs every 6 (six) hours as needed for wheezing or shortness of breath.  07/26/21   Hazel Sams, PA-C  Cholecalciferol (VITAMIN D3) 125 MCG (5000 UT) CAPS TAKE 1 CAPSULE BY MOUTH DAILY 05/27/21   Cassandria Anger, MD  hydrocortisone (CORTEF) 10 MG tablet '10mg'$  qam and 5 mg q PM 08/05/21   Nida, Marella Chimes, MD  hydrocortisone cream 1 % Apply to affected area 2 times daily for 7 days. 04/11/18   Elayne Snare, MD  hydrOXYzine (ATARAX/VISTARIL) 25 MG tablet Take 0.5-1  tablets (12.5-25 mg total) by mouth every 8 (eight) hours as needed for itching. 04/13/21   Jaynee Eagles, PA-C  Multiple Vitamin (MULTIVITAMIN WITH MINERALS) TABS tablet Take 1 tablet by mouth daily.    [provider]  Naphazoline HCl (CLEAR EYES OP) Apply 1 drop to eye daily as needed (dry eyes).    [provider]  naproxen (NAPROSYN) 250 MG tablet Take 1 tablet (250 mg total) by mouth 2 (two) times daily as needed for mild pain or moderate pain (take with food). 03/23/18   Francine Graven, DO  Testosterone 20.25 MG/ACT (1.62%) GEL Apply 20.'25mg'$ /ACT on each shoulder every morning 11/22/21   Cassandria Anger, MD  TIROSINT 100 MCG CAPS Take 1 capsule (100 mcg total) by mouth daily before breakfast. 08/05/21   Nida, Marella Chimes, MD    Family History Family History  Problem Relation Age of Onset   Hypertension Mother    Stroke Mother    Mental retardation Mother    Heart failure Father    Hypertension Sister    Bipolar disorder Son    Schizophrenia Son    Cancer - Colon Neg Hx        Is pretty sure his dad did NOT have colon CA but not 100% sure   Gastric cancer Neg Hx    Esophageal cancer Neg Hx     Social History Social History   Tobacco Use   Smoking status: Every Day    Packs/day: 0.50    Years: 40.00    Total pack years: 20.00    Types: Cigarettes   Smokeless tobacco: Never  Vaping Use   Vaping Use: Never used  Substance Use Topics   Alcohol use: No    Alcohol/week: 0.0 standard drinks of alcohol   Drug use: Not Currently    Types: Marijuana    Comment: denies use 01/16/18     Allergies   No known allergies   Review of Systems Review of Systems Per HPI  Physical Exam Triage Vital Signs ED Triage Vitals  Enc Vitals Group     BP 11/30/21 1401 132/81     Pulse Rate 11/30/21 1401 (!) 58     Resp 11/30/21 1401 18     Temp 11/30/21 1401 (!) 97.4 F (36.3 C)     Temp src --      SpO2 11/30/21 1401 98 %     Weight --      Height --       Head Circumference --      Peak Flow --      Pain Score 11/30/21 1359 5     Pain Loc --      Pain Edu? --      Excl. in [place]? --    No data found.  Updated Vital Signs BP 132/81   Pulse (!) 58   Temp (!) 97.4 F (36.3 C)   Resp 18   SpO2 98%   Visual Acuity Right Eye Distance:   Left Eye Distance:  Bilateral Distance:    Right Eye Near:   Left Eye Near:    Bilateral Near:     Physical Exam Vitals and nursing note reviewed.  Constitutional:      General: He is not in acute distress.    Appearance: Normal appearance. He is not toxic-appearing.  HENT:     Head: Normocephalic and atraumatic.     Mouth/Throat:     Mouth: Mucous membranes are moist.     Pharynx: Oropharynx is clear.  Pulmonary:     Effort: Pulmonary effort is normal. No respiratory distress.  Musculoskeletal:       Back:     Comments: TTP in the area marked; no obvious deformity, redness, or swelling.  Full ROM and sensation bilateral lower extremities.  Neurovascularly intact bilateral lower extremities.  Skin:    General: Skin is warm and dry.     Capillary Refill: Capillary refill takes less than 2 seconds.     Coloration: Skin is not jaundiced or pale.     Findings: No erythema.  Neurological:     Mental Status: He is alert and oriented to person, place, and time.  Psychiatric:        Behavior: Behavior is cooperative.      UC Treatments / Results  Labs (all labs ordered are listed, but only abnormal results are displayed) Labs Reviewed - No data to display  EKG   Radiology DG Lumbar Spine Complete  Result Date: 11/30/2021 CLINICAL DATA:  Low back pain EXAM: LUMBAR SPINE - COMPLETE 4+ VIEW COMPARISON:  Twelve scratch set CT 03/23/2018 FINDINGS: First degree anterolisthesis of L3 on L4 is noted. Mild retrolisthesis of L4 on L5 noted. The vertebral body heights are well preserved. No signs of acute fracture. Mild disc space narrowing and endplate spurring is noted at L1-2, L3-4 and  L4-5. IMPRESSION: 1. No acute findings. 2. Mild multilevel degenerative disc disease. 3. First degree anterolisthesis of L3 on L4 and mild retrolisthesis of L4 on L5. Electronically Signed   By: Kerby Moors M.D.   On: 11/30/2021 14:26    Procedures Procedures (including critical care time)  Medications Ordered in UC Medications - No data to display  Initial Impression / Assessment and Plan / UC Course  I have reviewed the triage vital signs and the nursing notes.  Pertinent labs & imaging results that were available during my care of the patient were reviewed by me and considered in my medical decision making (see chart for details).    Patient is a pleasant 61 year old male presenting with low back pain with left-sided sciatica.  He does have a history of chronic low back pain and follows closely with pain management.  Lumbar x-ray does not show any acute findings.  Discussed ice/heat, diclofenac gel, back strengthening exercises.  Unable to give steroids secondary to hydrocortisone and NSAIDs given the recent kidney function.  Follow-up with pain management if symptoms persist despite treatment. Final Clinical Impressions(s) / UC Diagnoses   Final diagnoses:  Acute left-sided low back pain with left-sided sciatica     Discharge Instructions      - The back x-ray today does not show any acute findings, there are some age-related changes in the discs in your spine -You can also try the back exercises attached as well as heat/ice and the NSAID gel  -Please follow-up with your pain management doctor if the pain persists without improvement with the treatment     ED Prescriptions  Medication Sig Dispense Auth. Provider   diclofenac Sodium (VOLTAREN) 1 % GEL Apply 2 g topically 4 (four) times daily. 2 g Eulogio Bear, NP      I have reviewed the PDMP during this encounter.   Eulogio Bear, NP 11/30/21 1441

## 2021-11-30 NOTE — ED Triage Notes (Signed)
Pt presents with c/o lower back and left leg pain that has been ongoing for past few months but has became worse recently

## 2021-12-05 ENCOUNTER — Telehealth: Payer: Self-pay | Admitting: "Endocrinology

## 2021-12-05 MED ORDER — TIROSINT 100 MCG PO CAPS: 100.0000 ug | ORAL_CAPSULE | Freq: Every day | ORAL | 1 refills | Status: DC

## 2021-12-19 ENCOUNTER — Other Ambulatory Visit: Payer: Self-pay | Admitting: "Endocrinology

## 2021-12-19 ENCOUNTER — Telehealth: Payer: Self-pay

## 2021-12-19 MED ORDER — TIROSINT 100 MCG PO CAPS
100.0000 ug | ORAL_CAPSULE | Freq: Every day | ORAL | 1 refills | Status: DC
Start: 2021-12-19 — End: 2021-12-21

## 2021-12-19 NOTE — Telephone Encounter (Signed)
[facility] However pt states he is out and he was only given #30. Please advise

## 2021-12-19 NOTE — Telephone Encounter (Signed)
Spoke to [facility]. They state that the pt is unable to get another prescription for 44 more days. Pharmacy confirms that the pt picked up 90 capsules on June 2nd. Spoke with pt again to see if he is only taking once daily. He states he is only taking once daily.

## 2021-12-20 NOTE — Telephone Encounter (Signed)
Notified pt to contact pharmacy to find out what happened since he states he only received a partial fill. They stated the pt could come up there and contest the fact that he received the 90 days worth of capsules

## 2021-12-21 ENCOUNTER — Other Ambulatory Visit: Payer: Self-pay

## 2021-12-21 MED ORDER — LEVOTHYROXINE SODIUM 100 MCG PO TABS: 100.0000 ug | ORAL_TABLET | Freq: Every day | ORAL | 1 refills | Status: DC

## 2021-12-21 NOTE — Telephone Encounter (Signed)
Per Dr Dorris Fetch Levothyroxine 100 mcg sent to [facility]. Pt could not get the Tirosint. Pt states too early and would have 36 more days before he could pick up. Pt states he was only given 30 capsules.

## 2022-01-14 ENCOUNTER — Other Ambulatory Visit: Payer: Self-pay | Admitting: "Endocrinology

## 2022-01-17 ENCOUNTER — Telehealth: Payer: Self-pay | Admitting: "Endocrinology

## 2022-01-17 ENCOUNTER — Other Ambulatory Visit: Payer: Self-pay

## 2022-01-17 MED ORDER — LEVOTHYROXINE SODIUM 100 MCG PO CAPS: 100.0000 ug | ORAL_CAPSULE | Freq: Every day | ORAL | 2 refills | Status: DC

## 2022-01-17 MED ORDER — LEVOTHYROXINE SODIUM 100 MCG PO TABS: 100.0000 ug | ORAL_TABLET | Freq: Every day | ORAL | 0 refills | Status: DC

## 2022-01-17 NOTE — Telephone Encounter (Signed)
Tirosinnt 158mg sent

## 2022-01-17 NOTE — Telephone Encounter (Signed)
Pt said that the pharmacy told him he can not get his Levothyroxine for another 11 days

## 2022-01-18 ENCOUNTER — Other Ambulatory Visit: Payer: Self-pay | Admitting: "Endocrinology

## 2022-01-30 ENCOUNTER — Ambulatory Visit
Admission: EM | Admit: 2022-01-30 | Discharge: 2022-01-30 | Disposition: A | Discharge status: Home / Self Care | Payer: Medicaid Other | Attending: Nurse Practitioner | Admitting: Nurse Practitioner

## 2022-01-30 DIAGNOSIS — R051 Acute cough: Secondary | ICD-10-CM | POA: Insufficient documentation

## 2022-01-30 DIAGNOSIS — Z20822 Contact with and (suspected) exposure to covid-19: Secondary | ICD-10-CM | POA: Diagnosis not present

## 2022-01-30 DIAGNOSIS — J029 Acute pharyngitis, unspecified: Secondary | ICD-10-CM | POA: Diagnosis present

## 2022-01-30 MED ORDER — BENZONATATE 100 MG PO CAPS: 100.0000 mg | ORAL_CAPSULE | Freq: Three times a day (TID) | ORAL | 0 refills | Status: DC | PRN

## 2022-01-30 MED ORDER — SALINE SPRAY 0.65 % NA SOLN: 1.0000 | NASAL | 0 refills | Status: AC | PRN

## 2022-01-30 NOTE — ED Provider Notes (Signed)
[facility]    CSN: [id number] Arrival date & time: 01/30/22  4854      History   Chief Complaint Chief Complaint  Patient presents with   Sore Throat    HPI Neil [name] is a 61 y.o. male.   Patient presents with scratchy throat and dry cough for the past day.  He denies fever, body aches, chills, congestion, shortness of breath, wheezing, chest pain or tightness, headache, ear pain or drainage, sinus pressure, abdominal pain, nausea/vomiting, diarrhea, and decreased appetite.  Also denies new rash or fatigue.  Has not taken anything for symptoms so far.  Reports girlfriend is also sick with similar symptoms.     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   Pituitary macroadenoma ([place]) 08/21/2016   Current smoker    Dyslipidemia, goal LDL below 100    Sebaceous cyst of left axilla 06/08/2015   Boil of trunk 06/08/2015   Healthcare maintenance 06/08/2015   Lumbar radiculopathy 03/16/2015   Urinary hesitancy 03/16/2015   Hyperlipidemia 03/05/2015   Chest pain 03/05/2015   Depression 11/02/2014    Schizophrenia ([place]) 11/02/2014    Past Surgical History:  Procedure Laterality Date   COLONOSCOPY  12/2011   Ascending and sigmoid diverticulosis, 2 polyps in rectosigmoid colon, possible polyp in rectum biopsied internal hemorrhoids, normal TI.  Pathology revealed hyperplastic polyps.  Recommended 10-year repeat.   CRANIOTOMY N/A 12/07/2016   Procedure: TRANSPHENOIDAL RESECTION OF TUMOR;  Surgeon: Newman Pies, MD;  Location: [place];  Service: Neurosurgery;  Laterality: N/A;  TRANSPHENOIDAL RESECTION OF TUMOR   HEMORROIDECTOMY  2007   TRANSNASAL APPROACH N/A 12/07/2016   Procedure: TRANSNASAL APPROACH;  Surgeon: Rozetta Nunnery, MD;  Location: [place];  Service: ENT;  Laterality: N/A;       Home Medications    Prior to Admission medications   Medication Sig Start Date End Date Taking? Authorizing Provider  benzonatate (TESSALON) 100 MG capsule Take 1 capsule (100 mg total) by mouth 3 (three) times daily as needed for cough. Do not take with alcohol or while driving or operating heavy machinery 01/30/22  Yes Noemi Chapel A, NP  sodium chloride (OCEAN) 0.65 % SOLN nasal spray Place 1 spray into both nostrils as needed for congestion. 01/30/22  Yes Eulogio Bear, NP  albuterol (VENTOLIN HFA) 108 (90 Base) MCG/ACT inhaler Inhale 1-2 puffs into the lungs every 6 (six) hours as needed for wheezing or shortness of breath. 07/26/21   Hazel Sams, PA-C  atorvastatin (LIPITOR) 10 MG tablet TAKE 1  TABLET BY MOUTH EVERY NIGHT AT BEDTIME 01/16/22   Cassandria Anger, MD  Cholecalciferol (VITAMIN D3) 125 MCG (5000 UT) CAPS TAKE 1 CAPSULE BY MOUTH DAILY 05/27/21   Cassandria Anger, MD  diclofenac Sodium (VOLTAREN) 1 % GEL Apply 2 g topically 4 (four) times daily. 11/30/21   Eulogio Bear, NP  hydrocortisone (CORTEF) 10 MG tablet '10mg'$  qam and 5 mg q PM 08/05/21   Cassandria Anger, MD  hydrocortisone cream 1 % Apply to affected area 2 times daily for 7 days. 04/11/18    Elayne Snare, MD  hydrOXYzine (ATARAX/VISTARIL) 25 MG tablet Take 0.5-1 tablets (12.5-25 mg total) by mouth every 8 (eight) hours as needed for itching. 04/13/21   Jaynee Eagles, PA-C  levothyroxine (SYNTHROID) 100 MCG tablet Take 1 tablet (100 mcg total) by mouth daily before breakfast. 01/17/22   Cassandria Anger, MD  Levothyroxine Sodium 100 MCG CAPS Take 1 capsule (100 mcg total) by mouth daily before breakfast. 01/17/22   Nida, Marella Chimes, MD  Multiple Vitamin (MULTIVITAMIN WITH MINERALS) TABS tablet Take 1 tablet by mouth daily.    [provider]  Naphazoline HCl (CLEAR EYES OP) Apply 1 drop to eye daily as needed (dry eyes).    [provider]  naproxen (NAPROSYN) 250 MG tablet Take 1 tablet (250 mg total) by mouth 2 (two) times daily as needed for mild pain or moderate pain (take with food). 03/23/18   Francine Graven, DO  Testosterone 20.25 MG/ACT (1.62%) GEL Apply 20.'25mg'$ /ACT on each shoulder every morning 11/22/21   Cassandria Anger, MD    Family History Family History  Problem Relation Age of Onset   Hypertension Mother    Stroke Mother    Mental retardation Mother    Heart failure Father    Hypertension Sister    Bipolar disorder Son    Schizophrenia Son    Cancer - Colon Neg Hx        Is pretty sure his dad did NOT have colon CA but not 100% sure   Gastric cancer Neg Hx    Esophageal cancer Neg Hx     Social History Social History   Tobacco Use   Smoking status: Every Day    Packs/day: 0.50    Years: 40.00    Total pack years: 20.00    Types: Cigarettes   Smokeless tobacco: Never  Vaping Use   Vaping Use: Never used  Substance Use Topics   Alcohol use: No    Alcohol/week: 0.0 standard drinks of alcohol   Drug use: Not Currently    Types: Marijuana    Comment: denies use 01/16/18     Allergies   No known allergies   Review of Systems Review of Systems Per HPI  Physical Exam Triage Vital Signs ED Triage Vitals  Enc  Vitals Group     BP 01/30/22 0849 126/87     Pulse Rate 01/30/22 0849 67     Resp 01/30/22 0849 18     Temp 01/30/22 0849 97.6 F (36.4 C)     Temp src --      SpO2 01/30/22 0849 97 %     Weight --      Height --      Head Circumference --      Peak Flow --      Pain Score 01/30/22 0847 1     Pain Loc --      Pain Edu? --  Excl. in [place]? --    No data found.  Updated Vital Signs BP 126/87   Pulse 67   Temp 97.6 F (36.4 C)   Resp 18   SpO2 97%   Visual Acuity Right Eye Distance:   Left Eye Distance:   Bilateral Distance:    Right Eye Near:   Left Eye Near:    Bilateral Near:     Physical Exam Vitals and nursing note reviewed.  Constitutional:      General: He is not in acute distress.    Appearance: Normal appearance. He is not ill-appearing or toxic-appearing.  HENT:     Head: Normocephalic and atraumatic.     Right Ear: Tympanic membrane, ear canal and external ear normal. No drainage, swelling or tenderness. No middle ear effusion. Tympanic membrane is not erythematous.     Left Ear: Tympanic membrane, ear canal and external ear normal. No drainage, swelling or tenderness.  No middle ear effusion. Tympanic membrane is not erythematous.     Nose: Rhinorrhea present. No congestion.     Mouth/Throat:     Mouth: Mucous membranes are moist.     Pharynx: Oropharynx is clear. Posterior oropharyngeal erythema present. No oropharyngeal exudate.     Tonsils: 0 on the right. 0 on the left.  Eyes:     General: No scleral icterus.    Extraocular Movements: Extraocular movements intact.  Cardiovascular:     Rate and Rhythm: Normal rate and regular rhythm.  Pulmonary:     Effort: Pulmonary effort is normal. No respiratory distress.     Breath sounds: Normal breath sounds. No wheezing, rhonchi or rales.  Abdominal:     General: Abdomen is flat.  Musculoskeletal:     Cervical back: Normal range of motion and neck supple.  Lymphadenopathy:     Cervical: No cervical  adenopathy.  Skin:    General: Skin is warm and dry.     Coloration: Skin is not jaundiced or pale.     Findings: No erythema or rash.  Neurological:     Mental Status: He is alert and oriented to person, place, and time.     Motor: No weakness.  Psychiatric:        Behavior: Behavior is cooperative.      UC Treatments / Results  Labs (all labs ordered are listed, but only abnormal results are displayed) Labs Reviewed  SARS CORONAVIRUS 2 (TAT 6-24 HRS)    EKG   Radiology No results found.  Procedures Procedures (including critical care time)  Medications Ordered in UC Medications - No data to display  Initial Impression / Assessment and Plan / UC Course  I have reviewed the triage vital signs and the nursing notes.  Pertinent labs & imaging results that were available during my care of the patient were reviewed by me and considered in my medical decision making (see chart for details).    Patient is a very pleasant, well-appearing 61 year old male presenting for scratchy throat and acute dry cough.   Discussed that symptoms are likely viral in etiology and supportive care discussed.  COVID-19 testing obtained.  He would be a good candidate for molnupiravir if COVID-19 testing is positive. ER precautions discussed.  Start Tessalon Perles to help with acute dry cough.  The patient was given the opportunity to ask questions.  All questions answered to their satisfaction.  The patient is in agreement to this plan.   Final Clinical Impressions(s) / UC Diagnoses  Final diagnoses:  Acute pharyngitis, unspecified etiology  Acute cough     Discharge Instructions      Your symptoms and exam findings are most consistent with a viral upper respiratory infection. These usually run their course in about 10 days.  If your symptoms last longer than 10 days without improvement, please follow up with your primary care provider.  If your symptoms, worsen, please go to the Emergency  Room.    We have tested you today for COVID-19.  You will see the results in Mychart and we will call you with positive results.    Please stay home and isolate until you are aware of the results.    Some things that can make you feel better are: - Increased rest - Increasing fluid with water/sugar free electrolytes - Acetaminophen and ibuprofen as needed for fever/pain.  - Salt water gargling, chloraseptic spray and throat lozenges - OTC guaifenesin (Mucinex) if congestion develops.  - Saline sinus flushes or a neti pot.  - Humidifying the air. - Tessalon perles every 8 hours as needed for dry cough.     ED Prescriptions     Medication Sig Dispense Auth. Provider   sodium chloride (OCEAN) 0.65 % SOLN nasal spray Place 1 spray into both nostrils as needed for congestion. 88 mL Noemi Chapel A, NP   benzonatate (TESSALON) 100 MG capsule Take 1 capsule (100 mg total) by mouth 3 (three) times daily as needed for cough. Do not take with alcohol or while driving or operating heavy machinery 21 capsule Eulogio Bear, NP      PDMP not reviewed this encounter.   Eulogio Bear, NP 01/30/22 1047

## 2022-01-30 NOTE — ED Triage Notes (Signed)
Pt presents with scratchy throat that began yesterday

## 2022-01-30 NOTE — Discharge Instructions (Addendum)
Your symptoms and exam findings are most consistent with a viral upper respiratory infection. These usually run their course in about 10 days.  If your symptoms last longer than 10 days without improvement, please follow up with your primary care provider.  If your symptoms, worsen, please go to the Emergency Room.    We have tested you today for COVID-19.  You will see the results in Mychart and we will call you with positive results.    Please stay home and isolate until you are aware of the results.    Some things that can make you feel better are: - Increased rest - Increasing fluid with water/sugar free electrolytes - Acetaminophen and ibuprofen as needed for fever/pain.  - Salt water gargling, chloraseptic spray and throat lozenges - OTC guaifenesin (Mucinex) if congestion develops.  - Saline sinus flushes or a neti pot.  - Humidifying the air. - Tessalon perles every 8 hours as needed for dry cough.

## 2022-01-31 LAB — SARS CORONAVIRUS 2 (TAT 6-24 HRS): SARS Coronavirus 2: NEGATIVE

## 2022-02-15 ENCOUNTER — Other Ambulatory Visit: Payer: Self-pay | Admitting: "Endocrinology

## 2022-02-16 ENCOUNTER — Telehealth: Payer: Self-pay | Admitting: "Endocrinology

## 2022-02-16 ENCOUNTER — Other Ambulatory Visit: Payer: Self-pay | Admitting: "Endocrinology

## 2022-02-16 MED ORDER — TESTOSTERONE 20.25 MG/ACT (1.62%) TD GEL: TRANSDERMAL | 1 refills | Status: DC

## 2022-02-16 NOTE — Telephone Encounter (Signed)
New message    Prior authorization   1. Which medications need to be refilled? (please list name of each medication and dose if known) Testosterone 20.25 MG/ACT (1.62%) GEL  2. Which pharmacy/location (including street and city if local pharmacy) is medication to be sent to? [facility] in [place]    3. Do they need a 30 day or 90 day supply? 30 days supply

## 2022-02-21 ENCOUNTER — Encounter: Payer: Self-pay | Admitting: *Deleted

## 2022-02-22 ENCOUNTER — Telehealth: Payer: Self-pay | Admitting: "Endocrinology

## 2022-02-22 NOTE — Telephone Encounter (Signed)
Pt called and said that his Testosterone Gel is requiring a PA. Please Advise

## 2022-02-23 NOTE — Telephone Encounter (Signed)
Medication approved through [place] tracks through 02/24/23 Approval # [id number]

## 2022-03-06 ENCOUNTER — Encounter: Payer: Self-pay | Admitting: *Deleted

## 2022-03-06 ENCOUNTER — Encounter: Payer: Self-pay | Admitting: Internal Medicine

## 2022-03-06 NOTE — Patient Instructions (Signed)
Patient marked constipation new onset on checklist. Pt will need OV as stated if you have any of those symptoms.

## 2022-03-07 ENCOUNTER — Ambulatory Visit: Payer: Medicaid Other | Admitting: "Endocrinology

## 2022-03-27 ENCOUNTER — Ambulatory Visit: Payer: Medicaid Other | Admitting: "Endocrinology

## 2022-03-29 ENCOUNTER — Telehealth: Payer: Self-pay | Admitting: "Endocrinology

## 2022-03-29 NOTE — Telephone Encounter (Signed)
Pt made aware

## 2022-03-29 NOTE — Telephone Encounter (Signed)
New message   1. Which medications need to be refilled? (please list name of each medication and dose if known) Testosterone 20.25 MG/ACT (1.62%) GEL  2. Which pharmacy/location (including street and city if local pharmacy) is medication to be sent to? [facility]     3. Do they need a 30 day or 90 day supply? 90 day supply

## 2022-04-02 LAB — COMPREHENSIVE METABOLIC PANEL
ALT: 21 IU/L (ref 0–44)
AST: 30 IU/L (ref 0–40)
Albumin/Globulin Ratio: 1.8 (ref 1.2–2.2)
Albumin: 4.6 g/dL (ref 3.9–4.9)
Alkaline Phosphatase: 72 IU/L (ref 44–121)
BUN/Creatinine Ratio: 10 (ref 10–24)
BUN: 14 mg/dL (ref 8–27)
Bilirubin Total: 0.4 mg/dL (ref 0.0–1.2)
CO2: 20 mmol/L (ref 20–29)
Calcium: 10.1 mg/dL (ref 8.6–10.2)
Chloride: 104 mmol/L (ref 96–106)
Creatinine, Ser: 1.34 mg/dL — ABNORMAL HIGH (ref 0.76–1.27)
Globulin, Total: 2.6 g/dL (ref 1.5–4.5)
Glucose: 95 mg/dL (ref 70–99)
Potassium: 4.5 mmol/L (ref 3.5–5.2)
Sodium: 140 mmol/L (ref 134–144)
Total Protein: 7.2 g/dL (ref 6.0–8.5)
eGFR: 60 mL/min/{1.73_m2} (ref >59)

## 2022-04-02 LAB — TESTOSTERONE, FREE, TOTAL, SHBG
Sex Hormone Binding: 45.6 nmol/L (ref 19.3–76.4)
Testosterone, Free: 26.6 pg/mL — ABNORMAL HIGH (ref 6.6–18.1)
Testosterone: 1248 ng/dL — ABNORMAL HIGH (ref 264–916)

## 2022-04-02 LAB — T4, FREE: Free T4: 1.21 ng/dL (ref 0.82–1.77)

## 2022-04-06 ENCOUNTER — Ambulatory Visit (INDEPENDENT_AMBULATORY_CARE_PROVIDER_SITE_OTHER): Payer: Medicaid Other | Admitting: Internal Medicine

## 2022-04-06 ENCOUNTER — Encounter: Payer: Self-pay | Admitting: Internal Medicine

## 2022-04-06 VITALS — BP 117/76 | HR 68 | Temp 98.0°F | Ht 68.0 in | Wt 161.2 lb

## 2022-04-06 DIAGNOSIS — K5900 Constipation, unspecified: Secondary | ICD-10-CM | POA: Diagnosis not present

## 2022-04-06 DIAGNOSIS — Z1211 Encounter for screening for malignant neoplasm of colon: Secondary | ICD-10-CM

## 2022-04-06 DIAGNOSIS — K644 Residual hemorrhoidal skin tags: Secondary | ICD-10-CM

## 2022-04-06 NOTE — Progress Notes (Signed)
Primary Care Physician:  [facility] Primary Gastroenterologist:  Dr. Abbey Chatters  Chief Complaint  Patient presents with   Colonoscopy    Pt here for colonoscopy visit    HPI:   Neil [name] is a 61 y.o. male who presents to the clinic today by referral from the [facility] clinic to discuss colonoscopy.  Last colonoscopy 2013 with few small hyperplastic polyps removed.  No family history of colorectal malignancy.  No melena hematochezia.  No abdominal pain.  No unintentional weight loss.  Denies any upper GI symptoms including heartburn, reflux, dysphagia/odynophagia, epigastric chest pain, nausea.  Does have issues with external hemorrhoids which cause pain, some itching.  States he had a hemorrhoidectomy many years ago.  Also with mild chronic constipation.  Has started taking probiotics that have prune in them and states this is better controlled.  Symptoms mild in severity, intermittent in nature.  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   Tobacco abuse     Past Surgical History:  Procedure Laterality Date   COLONOSCOPY  12/2011   Ascending and sigmoid diverticulosis, 2 polyps in rectosigmoid colon, possible polyp in rectum biopsied internal hemorrhoids, normal TI.  Pathology revealed hyperplastic polyps.  Recommended 10-year repeat.   CRANIOTOMY N/A 12/07/2016   Procedure: TRANSPHENOIDAL RESECTION OF TUMOR;  Surgeon: Newman Pies, MD;  Location: [facility];  Service: Neurosurgery;  Laterality: N/A;  TRANSPHENOIDAL RESECTION OF TUMOR   HEMORROIDECTOMY   2007   TRANSNASAL APPROACH N/A 12/07/2016   Procedure: TRANSNASAL APPROACH;  Surgeon: Rozetta Nunnery, MD;  Location: [facility] OR;  Service: ENT;  Laterality: N/A;    Current Outpatient Medications  Medication Sig Dispense Refill   atorvastatin (LIPITOR) 10 MG tablet TAKE 1 TABLET BY MOUTH EVERY NIGHT AT BEDTIME 90 tablet 1   Cholecalciferol (VITAMIN D3) 125 MCG (5000 UT) CAPS TAKE 1 CAPSULE BY MOUTH DAILY 90 capsule 1   hydrocortisone (CORTEF) 10 MG tablet '10mg'$  qam and 5 mg q PM 165 tablet 1   hydrocortisone cream 1 % Apply to affected area 2 times daily for 7 days. 20 g 0   Levothyroxine Sodium 100 MCG CAPS Take 1 capsule (100 mcg total) by mouth daily before breakfast. 30 capsule 2   Multiple Vitamin (MULTIVITAMIN WITH MINERALS) TABS tablet Take 1 tablet by mouth daily.     Naphazoline HCl (CLEAR EYES OP) Apply 1 drop to eye daily as needed (dry eyes).     Testosterone 20.25 MG/ACT (1.62%) GEL Apply 20.'25mg'$ /ACT on each shoulder every morning 75 g 1   albuterol (VENTOLIN HFA) 108 (90 Base) MCG/ACT inhaler Inhale 1-2 puffs into the lungs every 6 (six) hours as needed for wheezing or shortness of breath. (Patient not taking: Reported on 04/06/2022) 1 each 0   benzonatate (TESSALON) 100 MG capsule Take 1 capsule (100 mg total) by mouth 3 (three) times daily as needed for cough. Do not take with alcohol or while driving or operating heavy machinery (Patient not taking:  Reported on 04/06/2022) 21 capsule 0   diclofenac Sodium (VOLTAREN) 1 % GEL Apply 2 g topically 4 (four) times daily. (Patient not taking: Reported on 04/06/2022) 2 g 0   hydrOXYzine (ATARAX/VISTARIL) 25 MG tablet Take 0.5-1 tablets (12.5-25 mg total) by mouth every 8 (eight) hours as needed for itching. (Patient not taking: Reported on 04/06/2022) 30 tablet 0   levothyroxine (SYNTHROID) 100 MCG tablet Take 1 tablet (100 mcg total) by mouth daily before breakfast. (Patient not taking: Reported on 04/06/2022) 90 tablet 0   naproxen  (NAPROSYN) 250 MG tablet Take 1 tablet (250 mg total) by mouth 2 (two) times daily as needed for mild pain or moderate pain (take with food). (Patient not taking: Reported on 04/06/2022) 14 tablet 0   sodium chloride (OCEAN) 0.65 % SOLN nasal spray Place 1 spray into both nostrils as needed for congestion. (Patient not taking: Reported on 04/06/2022) 88 mL 0   No current facility-administered medications for this visit.    Allergies as of 04/06/2022 - Review Complete 04/06/2022  Allergen Reaction Noted   No known allergies  12/06/2016    Family History  Problem Relation Age of Onset   Hypertension Mother    Stroke Mother    Mental retardation Mother    Heart failure Father    Hypertension Sister    Bipolar disorder Son    Schizophrenia Son    Cancer - Colon Neg Hx        Is pretty sure his dad did NOT have colon CA but not 100% sure   Gastric cancer Neg Hx    Esophageal cancer Neg Hx     Social History   Socioeconomic History   Marital status: Single    Spouse name: Not on file   Number of children: 2   Years of education: 12   Highest education level: Not on file  Occupational History   Occupation: unemployed carpentry/upholstery  Tobacco Use   Smoking status: Every Day    Packs/day: 0.50    Years: 40.00    Total pack years: 20.00    Types: Cigarettes   Smokeless tobacco: Never  Vaping Use   Vaping Use: Never used  Substance and Sexual Activity   Alcohol use: No    Alcohol/week: 0.0 standard drinks of alcohol   Drug use: Not Currently    Types: Marijuana    Comment: denies use 01/16/18   Sexual activity: Not Currently    Birth control/protection: None  Other Topics Concern   Not on file  Social History Narrative   Born in [place], [place]   Has lived in [place] for years.   Currently lives with maternal uncle, who also smokes, but is trying to get his own place.     Working on disability.   Social Determinants of Health   Financial Resource Strain: Not  on file  Food Insecurity: Not on file  Transportation Needs: Not on file  Physical Activity: Not on file  Stress: Not on file  Social Connections: Not on file  Intimate Partner Violence: Not on file    Subjective: Review of Systems  Constitutional:  Negative for chills and fever.  HENT:  Negative for congestion and hearing loss.   Eyes:  Negative for blurred vision and double vision.  Respiratory:  Negative for cough and shortness of breath.   Cardiovascular:  Negative for chest pain and palpitations.  Gastrointestinal:  Positive for constipation. Negative for abdominal pain, blood in stool, diarrhea, heartburn, melena and  vomiting.  Genitourinary:  Negative for dysuria and urgency.  Musculoskeletal:  Negative for joint pain and myalgias.  Skin:  Negative for itching and rash.  Neurological:  Negative for dizziness and headaches.  Psychiatric/Behavioral:  Negative for depression. The patient is not nervous/anxious.        Objective: BP 117/76   Pulse 68   Temp 98 F (36.7 C)   Ht '5\' 8"'$  (1.727 m)   Wt 161 lb 3.2 oz (73.1 kg)   BMI 24.51 kg/m  Physical Exam Constitutional:      Appearance: Normal appearance.  HENT:     Head: Normocephalic and atraumatic.  Eyes:     Extraocular Movements: Extraocular movements intact.     Conjunctiva/sclera: Conjunctivae normal.  Cardiovascular:     Rate and Rhythm: Normal rate and regular rhythm.  Pulmonary:     Effort: Pulmonary effort is normal.     Breath sounds: Normal breath sounds.  Abdominal:     General: Bowel sounds are normal.     Palpations: Abdomen is soft.  Musculoskeletal:        General: Normal range of motion.     Cervical back: Normal range of motion and neck supple.  Skin:    General: Skin is warm.  Neurological:     General: No focal deficit present.     Mental Status: He is alert and oriented to person, place, and time.  Psychiatric:        Mood and Affect: Mood normal.        Behavior: Behavior normal.    Rectal exam deferred until time of colonoscopy.   Assessment: *Colon cancer screening *Constipation-mild, improved on probiotics *External hemorrhoids  Plan: Will schedule for screening colonoscopy.The risks including infection, bleed, or perforation as well as benefits, limitations, alternatives and imponderables have been reviewed with the patient. Questions have been answered. All parties agreeable.  We will closely inspect external hemorrhoids during colonoscopy.  May need surgical referral.  Constipation improved on probiotics.  We will continue.    04/06/2022 3:04 PM   Disclaimer: This note was dictated with voice recognition software. Similar sounding words can inadvertently be transcribed and may not be corrected upon review.

## 2022-04-06 NOTE — Patient Instructions (Signed)
We will schedule you for colonoscopy for colon cancer screening purposes.  I will also evaluate your hemorrhoids at time of procedure.  Continue on probiotics for your chronic constipation.  It was very nice meeting you today.  Dr. Abbey Chatters

## 2022-04-07 ENCOUNTER — Encounter: Payer: Self-pay | Admitting: *Deleted

## 2022-04-07 MED ORDER — PEG 3350-KCL-NA BICARB-NACL 420 G PO SOLR: 4000.0000 mL | Freq: Once | ORAL | 0 refills | Status: AC

## 2022-04-13 ENCOUNTER — Ambulatory Visit (INDEPENDENT_AMBULATORY_CARE_PROVIDER_SITE_OTHER): Payer: Medicaid Other | Admitting: "Endocrinology

## 2022-04-13 ENCOUNTER — Telehealth: Payer: Self-pay | Admitting: *Deleted

## 2022-04-13 ENCOUNTER — Encounter: Payer: Self-pay | Admitting: "Endocrinology

## 2022-04-13 VITALS — BP 108/64 | HR 78 | Ht 68.0 in | Wt 160.0 lb

## 2022-04-13 DIAGNOSIS — E039 Hypothyroidism, unspecified: Secondary | ICD-10-CM

## 2022-04-13 DIAGNOSIS — E2740 Unspecified adrenocortical insufficiency: Secondary | ICD-10-CM | POA: Diagnosis not present

## 2022-04-13 DIAGNOSIS — E230 Hypopituitarism: Secondary | ICD-10-CM | POA: Diagnosis not present

## 2022-04-13 DIAGNOSIS — E291 Testicular hypofunction: Secondary | ICD-10-CM | POA: Diagnosis not present

## 2022-04-13 DIAGNOSIS — F17200 Nicotine dependence, unspecified, uncomplicated: Secondary | ICD-10-CM

## 2022-04-13 NOTE — Telephone Encounter (Signed)
Pt called in to cancel procedure for 11/9. He did not want to reschedule at this time. He said he wants to think about it 1st.
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
## 2022-04-20 ENCOUNTER — Encounter (HOSPITAL_COMMUNITY): Payer: Self-pay

## 2022-04-20 ENCOUNTER — Encounter: Payer: Self-pay | Admitting: *Deleted

## 2022-04-20 ENCOUNTER — Other Ambulatory Visit: Payer: Self-pay | Admitting: "Endocrinology

## 2022-04-20 ENCOUNTER — Ambulatory Visit (HOSPITAL_COMMUNITY): Admit: 2022-04-20 | Payer: Medicaid Other

## 2022-04-20 DIAGNOSIS — Z1211 Encounter for screening for malignant neoplasm of colon: Secondary | ICD-10-CM

## 2022-04-20 SURGERY — COLONOSCOPY WITH PROPOFOL
Anesthesia: Monitor Anesthesia Care

## 2022-04-20 NOTE — Telephone Encounter (Signed)
Pt came by the office to ask questions about a colonoscopy. He states that he had to take medication for a brain tumor every morning and he has to take them with milk. He is concerned because instructions say clear liquid. He wants to know if he could do the cologuard. Also, he said that you mentioned a cream for his hemorrhoids. Please advise. Thank you

## 2022-04-21 MED ORDER — HYDROCORTISONE (PERIANAL) 2.5 % EX CREA
1.0000 | TOPICAL_CREAM | Freq: Two times a day (BID) | CUTANEOUS | 2 refills | Status: DC
Start: 2022-04-21 — End: 2023-03-01

## 2022-04-21 NOTE — Telephone Encounter (Signed)
Patient wishes to hold off on colonoscopy and have Cologuard done instead.  I will order today.  Please make him aware that if Cologuard positive he will need a colonoscopy to further evaluate.  Please also let him know I will send in Anusol cream and for his hemorrhoids.  Thank you

## 2022-04-24 NOTE — Telephone Encounter (Signed)
Pt aware of Anusol cream sent to pharmacy

## 2022-05-09 ENCOUNTER — Other Ambulatory Visit: Payer: Self-pay | Admitting: "Endocrinology

## 2022-05-18 LAB — COLOGUARD: COLOGUARD: NEGATIVE

## 2022-06-09 ENCOUNTER — Other Ambulatory Visit: Payer: Self-pay | Admitting: "Endocrinology

## 2022-06-09 ENCOUNTER — Telehealth: Payer: Self-pay | Admitting: "Endocrinology

## 2022-06-09 MED ORDER — TESTOSTERONE 20.25 MG/ACT (1.62%) TD GEL: TRANSDERMAL | 0 refills | Status: DC

## 2022-06-09 NOTE — Telephone Encounter (Signed)
Pt requesting a refill on his ANDROGEL 50 MG/5GM (1%) GEL [place].

## 2022-07-05 ENCOUNTER — Telehealth: Payer: Self-pay | Admitting: "Endocrinology

## 2022-07-05 NOTE — Telephone Encounter (Signed)
Pt states he needs a prior authorization on his hydrocortisone '10mg'$   tablet

## 2022-07-14 ENCOUNTER — Ambulatory Visit: Payer: Medicaid Other | Admitting: "Endocrinology

## 2022-07-19 ENCOUNTER — Other Ambulatory Visit (HOSPITAL_COMMUNITY): Payer: Self-pay

## 2022-07-19 NOTE — Telephone Encounter (Signed)
Per test claim PA is not needed at this time through patients Medicaid coverage.

## 2022-07-20 ENCOUNTER — Telehealth: Payer: Self-pay

## 2022-07-20 ENCOUNTER — Other Ambulatory Visit: Payer: Self-pay | Admitting: "Endocrinology

## 2022-07-20 DIAGNOSIS — E230 Hypopituitarism: Secondary | ICD-10-CM

## 2022-07-20 DIAGNOSIS — D352 Benign neoplasm of pituitary gland: Secondary | ICD-10-CM

## 2022-07-20 NOTE — Telephone Encounter (Signed)
Pt called stating his insurance is requiring a PA for his hydrocortisone (cortef) '10mg'$  1 tablet by mouth every morning and 1/2 tablet every evening.

## 2022-07-21 ENCOUNTER — Telehealth: Payer: Self-pay | Admitting: "Endocrinology

## 2022-07-21 DIAGNOSIS — E039 Hypothyroidism, unspecified: Secondary | ICD-10-CM

## 2022-07-21 NOTE — Telephone Encounter (Signed)
Pt states that the Tirosint is causing his hair to fall out, he would like to switch back to Levothyroxine.

## 2022-07-24 MED ORDER — LEVOTHYROXINE SODIUM 100 MCG PO TABS: 100.0000 ug | ORAL_TABLET | Freq: Every day | ORAL | 1 refills | Status: DC

## 2022-07-24 NOTE — Telephone Encounter (Signed)
[facility] 142mg [facility] before [facility]

## 2022-08-08 ENCOUNTER — Other Ambulatory Visit (HOSPITAL_COMMUNITY): Payer: Self-pay

## 2022-08-08 NOTE — Telephone Encounter (Signed)
Called the pharmacy because my test claim went through for $0. They said the pt picked it up on the 8th, #45. Not sure why he thinks it needs a PA.

## 2022-08-08 NOTE — Telephone Encounter (Signed)
Thank you for checking on that and all your help.

## 2022-08-11 ENCOUNTER — Encounter: Payer: Self-pay | Admitting: *Deleted

## 2022-08-23 ENCOUNTER — Ambulatory Visit (INDEPENDENT_AMBULATORY_CARE_PROVIDER_SITE_OTHER): Payer: Medicaid Other | Admitting: "Endocrinology

## 2022-08-23 ENCOUNTER — Encounter: Payer: Self-pay | Admitting: "Endocrinology

## 2022-08-23 VITALS — BP 118/76 | HR 60 | Ht 68.0 in | Wt 158.2 lb

## 2022-08-23 DIAGNOSIS — E039 Hypothyroidism, unspecified: Secondary | ICD-10-CM | POA: Diagnosis not present

## 2022-08-23 DIAGNOSIS — E230 Hypopituitarism: Secondary | ICD-10-CM | POA: Diagnosis not present

## 2022-08-23 DIAGNOSIS — E2740 Unspecified adrenocortical insufficiency: Secondary | ICD-10-CM

## 2022-08-23 DIAGNOSIS — E291 Testicular hypofunction: Secondary | ICD-10-CM

## 2022-08-23 MED ORDER — TESTOSTERONE 20.25 MG/ACT (1.62%) TD GEL: TRANSDERMAL | 0 refills | Status: DC
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
## 2022-08-24 LAB — TESTOSTERONE, FREE, TOTAL, SHBG
Sex Hormone Binding: 35.1 nmol/L (ref 19.3–76.4)
Testosterone, Free: <0.2 pg/mL — ABNORMAL LOW (ref 6.6–18.1)
Testosterone: 23 ng/dL — ABNORMAL LOW (ref 264–916)

## 2022-08-24 LAB — T4, FREE: Free T4: 1.54 ng/dL (ref 0.82–1.77)

## 2022-09-18 ENCOUNTER — Other Ambulatory Visit: Payer: Self-pay | Admitting: "Endocrinology

## 2022-11-28 ENCOUNTER — Ambulatory Visit: Payer: Medicaid Other | Admitting: "Endocrinology

## 2022-11-30 ENCOUNTER — Other Ambulatory Visit: Payer: Self-pay | Admitting: "Endocrinology

## 2022-11-30 ENCOUNTER — Ambulatory Visit: Payer: Medicaid Other | Admitting: "Endocrinology

## 2022-11-30 NOTE — Telephone Encounter (Signed)
Pt rescheduled appt form this morning but he is requesting a refill on his Androgel.

## 2022-12-01 ENCOUNTER — Telehealth: Payer: Self-pay | Admitting: "Endocrinology

## 2022-12-01 NOTE — Telephone Encounter (Signed)
Pt has called wanting lab results.  Pt missed appointment and I have made him a new one for 12/12/22.

## 2022-12-01 NOTE — Telephone Encounter (Signed)
Left a message requesting pt return call to the office. 

## 2022-12-03 LAB — COMPREHENSIVE METABOLIC PANEL
ALT: 19 IU/L (ref 0–44)
AST: 18 IU/L (ref 0–40)
Albumin: 4.5 g/dL (ref 3.9–4.9)
Alkaline Phosphatase: 69 IU/L (ref 44–121)
BUN/Creatinine Ratio: 13 (ref 10–24)
BUN: 16 mg/dL (ref 8–27)
Bilirubin Total: 0.4 mg/dL (ref 0.0–1.2)
CO2: 23 mmol/L (ref 20–29)
Calcium: 9.9 mg/dL (ref 8.6–10.2)
Chloride: 104 mmol/L (ref 96–106)
Creatinine, Ser: 1.19 mg/dL (ref 0.76–1.27)
Globulin, Total: 2.1 g/dL (ref 1.5–4.5)
Glucose: 93 mg/dL (ref 70–99)
Potassium: 4.6 mmol/L (ref 3.5–5.2)
Sodium: 140 mmol/L (ref 134–144)
Total Protein: 6.6 g/dL (ref 6.0–8.5)
eGFR: 69 mL/min/{1.73_m2} (ref >59)

## 2022-12-03 LAB — LIPID PANEL
Chol/HDL Ratio: 6.1 ratio — ABNORMAL HIGH (ref 0.0–5.0)
Cholesterol, Total: 236 mg/dL — ABNORMAL HIGH (ref 100–199)
HDL: 39 mg/dL — ABNORMAL LOW (ref >39)
LDL Chol Calc (NIH): 165 mg/dL — ABNORMAL HIGH (ref 0–99)
Triglycerides: 173 mg/dL — ABNORMAL HIGH (ref 0–149)
VLDL Cholesterol Cal: 32 mg/dL (ref 5–40)

## 2022-12-03 LAB — T4, FREE: Free T4: 1.5 ng/dL (ref 0.82–1.77)

## 2022-12-03 LAB — PROLACTIN: Prolactin: 8.4 ng/mL (ref 3.6–25.2)

## 2022-12-03 LAB — TESTOSTERONE, FREE, TOTAL, SHBG
Sex Hormone Binding: 42.3 nmol/L (ref 19.3–76.4)
Testosterone, Free: <0.2 pg/mL — ABNORMAL LOW (ref 6.6–18.1)
Testosterone: 37 ng/dL — ABNORMAL LOW (ref 264–916)

## 2022-12-04 NOTE — Telephone Encounter (Signed)
Pt called afterhours line back requesting a call back

## 2022-12-04 NOTE — Telephone Encounter (Signed)
Pt's testosterone has resulted. Please advise.

## 2022-12-04 NOTE — Telephone Encounter (Signed)
Spoke with pt made him aware he would need to keep his appointment before Dr.Nida would refill his testosterone per Dr.Nida's orders. Pt voiced understanding.

## 2022-12-12 ENCOUNTER — Ambulatory Visit (INDEPENDENT_AMBULATORY_CARE_PROVIDER_SITE_OTHER): Payer: Medicaid Other | Admitting: "Endocrinology

## 2022-12-12 ENCOUNTER — Encounter: Payer: Self-pay | Admitting: "Endocrinology

## 2022-12-12 VITALS — BP 104/72 | HR 64 | Ht 68.0 in | Wt 160.2 lb

## 2022-12-12 DIAGNOSIS — E291 Testicular hypofunction: Secondary | ICD-10-CM

## 2022-12-12 DIAGNOSIS — E230 Hypopituitarism: Secondary | ICD-10-CM

## 2022-12-12 DIAGNOSIS — E2740 Unspecified adrenocortical insufficiency: Secondary | ICD-10-CM

## 2022-12-12 DIAGNOSIS — E782 Mixed hyperlipidemia: Secondary | ICD-10-CM | POA: Diagnosis not present

## 2022-12-12 DIAGNOSIS — E039 Hypothyroidism, unspecified: Secondary | ICD-10-CM

## 2022-12-12 MED ORDER — TESTOSTERONE 1.62 % TD GEL: TRANSDERMAL | 2 refills | Status: DC

## 2022-12-12 MED ORDER — ATORVASTATIN CALCIUM 10 MG PO TABS: 10.0000 mg | ORAL_TABLET | Freq: Every day | ORAL | 1 refills | Status: DC
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
## 2022-12-26 ENCOUNTER — Other Ambulatory Visit: Payer: Self-pay | Admitting: "Endocrinology

## 2022-12-26 DIAGNOSIS — E039 Hypothyroidism, unspecified: Secondary | ICD-10-CM

## 2023-01-09 ENCOUNTER — Ambulatory Visit: Payer: Medicaid Other | Admitting: "Endocrinology

## 2023-01-10 ENCOUNTER — Telehealth: Payer: Self-pay | Admitting: "Endocrinology

## 2023-01-10 NOTE — Telephone Encounter (Signed)
Left a message requesting pt return call to the office. 

## 2023-01-10 NOTE — Telephone Encounter (Signed)
Pt said you told him to call when he needed a RX for his night sweats. Please Advise

## 2023-01-11 NOTE — Telephone Encounter (Signed)
Left a message requesting pt return call to the office. 

## 2023-01-12 NOTE — Telephone Encounter (Signed)
Tried to return a call to pt but had to leave a message requesting he return call to the office.

## 2023-01-15 NOTE — Telephone Encounter (Signed)
Discussed with pt, understanding voiced. 

## 2023-02-10 ENCOUNTER — Other Ambulatory Visit: Payer: Self-pay

## 2023-02-10 ENCOUNTER — Emergency Department (HOSPITAL_COMMUNITY): Admission: EM | Admit: 2023-02-10 | Payer: Medicaid Other | Source: Home / Self Care

## 2023-02-10 ENCOUNTER — Encounter (HOSPITAL_COMMUNITY): Payer: Self-pay

## 2023-02-10 DIAGNOSIS — L02212 Cutaneous abscess of back [any part, except buttock]: Secondary | ICD-10-CM

## 2023-02-10 DIAGNOSIS — M545 Low back pain, unspecified: Secondary | ICD-10-CM | POA: Diagnosis present

## 2023-02-10 MED ORDER — DOXYCYCLINE HYCLATE 100 MG PO CAPS: 100.0000 mg | ORAL_CAPSULE | Freq: Two times a day (BID) | ORAL | 0 refills | Status: DC

## 2023-02-10 MED ORDER — DOXYCYCLINE HYCLATE 100 MG PO TABS
100.0000 mg | ORAL_TABLET | Freq: Once | ORAL | Status: AC
  Administered 2023-02-11: 100 mg via ORAL
  Filled 2023-02-10: qty 1

## 2023-02-10 MED ORDER — LIDOCAINE-EPINEPHRINE (PF) 2 %-1:200000 IJ SOLN
10.0000 mL | Freq: Once | INTRAMUSCULAR | Status: DC
  Filled 2023-02-10: qty 20

## 2023-02-10 NOTE — ED Provider Notes (Signed)
[facility]ERGENCY DEPARTMENT AT [facility] Provider Note   CSN: [id number] Arrival date & time: 02/10/23  2032     History  Chief Complaint  Patient presents with   Insect Bite    Neil [name] is a 62 y.o. male.  The history is provided by the patient.  He has history of hyperlipidemia, hypopituitarism and comes in complaining of a painful, swollen area in his left lumbar area which has been present for the last 2 days and getting worse.  He thinks it may have started as a bug bite.  He denies fever or chills.  He denies any itching.   Home Medications Prior to Admission medications   Medication Sig Start Date End Date Taking? Authorizing Provider  albuterol (VENTOLIN HFA) 108 (90 Base) MCG/ACT inhaler Inhale 1-2 puffs into the lungs every 6 (six) hours as needed for wheezing or shortness of breath. Patient not taking: Reported on 04/06/2022 07/26/21   Rhys Martini, PA-C  atorvastatin (LIPITOR) 10 MG tablet Take 1 tablet (10 mg total) by mouth at bedtime. 12/12/22   Roma Kayser, MD  benzonatate (TESSALON) 100 MG capsule Take 1 capsule (100 mg total) by mouth 3 (three) times daily as needed for cough. Do not take with alcohol or while driving or operating heavy machinery Patient not taking: Reported on 04/06/2022 01/30/22   Valentino Nose, NP  Cholecalciferol (VITAMIN D3) 125 MCG (5000 UT) CAPS TAKE 1 CAPSULE BY MOUTH DAILY 05/27/21   Roma Kayser, MD  diclofenac Sodium (VOLTAREN) 1 % GEL Apply 2 g topically 4 (four) times daily. 11/30/21   Valentino Nose, NP  hydrocortisone (ANUSOL-HC) 2.5 % rectal cream Place 1 Application rectally 2 (two) times daily. 04/21/22   Lanelle Bal, DO  hydrocortisone (CORTEF) 10 MG tablet TAKE 1 TABLET BY MOUTH EVERY MORNING AND 1/2 TABLET EVERY EVENING 07/20/22   Roma Kayser, MD  hydrocortisone cream 1 % Apply to affected area 2 times daily for 7 days. 04/11/18   Reather Littler, MD  hydrOXYzine  (ATARAX/VISTARIL) 25 MG tablet Take 0.5-1 tablets (12.5-25 mg total) by mouth every 8 (eight) hours as needed for itching. Patient not taking: Reported on 04/06/2022 04/13/21   Wallis Bamberg, PA-C  levothyroxine (SYNTHROID) 100 MCG tablet TAKE 1 TABLET(100 MCG) BY MOUTH DAILY BEFORE BREAKFAST 12/26/22   Roma Kayser, MD  Multiple Vitamin (MULTIVITAMIN WITH MINERALS) TABS tablet Take 1 tablet by mouth daily.    [provider]  Naphazoline HCl (CLEAR EYES OP) Apply 1 drop to eye daily as needed (dry eyes).    [provider]  naproxen (NAPROSYN) 250 MG tablet Take 1 tablet (250 mg total) by mouth 2 (two) times daily as needed for mild pain or moderate pain (take with food). Patient not taking: Reported on 04/06/2022 03/23/18   Samuel Jester, DO  sodium chloride (OCEAN) 0.65 % SOLN nasal spray Place 1 spray into both nostrils as needed for congestion. Patient not taking: Reported on 04/06/2022 01/30/22   Valentino Nose, NP  Testosterone 1.62 % GEL APPLY 1 PUMP ON EACH SHOULDER EVERY MORNING 12/12/22   Roma Kayser, MD      Allergies    No known allergies    Review of Systems   Review of Systems  All other systems reviewed and are negative.   Physical Exam Updated Vital Signs BP 121/84 (BP Location: Right Arm)   Pulse 76   Temp 98 F (36.7 C) (Oral)  Resp 18   Ht 5\' 8"  (1.727 m)   Wt 72.6 kg   SpO2 100%   BMI 24.33 kg/m  Physical Exam Vitals and nursing note reviewed.   62 year old male, resting comfortably and in no acute distress. Vital signs are normal. Oxygen saturation is 100%, which is normal. Head is normocephalic and atraumatic. PERRLA, EOMI.  Back sick centimeter by 4 cm area of erythema and slight swelling without fluctuance.  There is minimal erythema. Lungs are clear without rales, wheezes, or rhonchi. Chest is nontender. Heart has regular rate and rhythm without murmur. Abdomen is soft, flat, nontender. Skin is warm and dry  without rash. Neurologic: Mental status is normal, cranial nerves are intact, moves all extremities equally.  ED Results / Procedures / Treatments    Procedures Ultrasound ED Soft Tissue  Date/Time: 02/10/2023 11:16 PM  Performed by: Dione Booze, MD Authorized by: Dione Booze, MD   Procedure details:    Indications: localization of abscess     Transverse view:  Visualized   Longitudinal view:  Visualized   Images: archived   Location:    Location: lower back     Side:  Left Findings:     abscess present .Marland KitchenIncision and Drainage  Date/Time: 02/10/2023 11:43 PM  Performed by: Dione Booze, MD Authorized by: Dione Booze, MD   Consent:    Consent obtained:  Verbal   Consent given by:  Patient   Risks, benefits, and alternatives were discussed: yes     Risks discussed:  Bleeding, incomplete drainage and pain   Alternatives discussed:  Alternative treatment Universal protocol:    Procedure explained and questions answered to patient or proxy's satisfaction: yes     Relevant documents present and verified: yes     Required blood products, implants, devices, and special equipment available: yes     Site/side marked: yes     Immediately prior to procedure, a time out was called: yes     Patient identity confirmed:  Verbally with patient and arm band Location:    Type:  Abscess   Location:  Trunk   Trunk location:  Back Pre-procedure details:    Skin preparation:  Antiseptic wash Sedation:    Sedation type:  None Anesthesia:    Anesthesia method:  Local infiltration   Local anesthetic:  Lidocaine 2% WITH epi Procedure type:    Complexity:  Complex Procedure details:    Ultrasound guidance: yes     Needle aspiration: no     Incision types:  Cruciate   Incision depth:  Subcutaneous   Wound management:  Probed and deloculated   Drainage:  Purulent   Drainage amount:  Scant   Wound treatment:  Wound left open   Packing materials:  None Post-procedure details:     Procedure completion:  Tolerated well, no immediate complications     Medications Ordered in ED Medications  lidocaine-EPINEPHrine (XYLOCAINE W/EPI) 2 %-1:200000 (PF) injection 10 mL (has no administration in time range)  doxycycline (VIBRA-TABS) tablet 100 mg (has no administration in time range)    ED Course/ Medical Decision Making/ A&P                                 Medical Decision Making  Painful, swollen area in the left lumbar region suspicious for abscess.  I performed a bedside ultrasound which confirmed a small area of purulence and proceeded with incision and drainage.  There is only a small amount of purulent material expressed, so I am concerned that there may be significant component of cellulitis as well as the abscess.  I have ordered a dose of doxycycline and I am discharging him with a prescription for doxycycline.  Final Clinical Impression(s) / ED Diagnoses Final diagnoses:  Abscess of lower back    Rx / DC Orders ED Discharge Orders          Ordered    doxycycline (VIBRAMYCIN) 100 MG capsule  2 times daily,   Status:  Discontinued        02/10/23 2342    doxycycline (VIBRAMYCIN) 100 MG capsule  2 times daily        02/10/23 2342              Dione Booze, MD 02/10/23 2345

## 2023-02-10 NOTE — ED Triage Notes (Signed)
Large bite to mid back x2 days Noted redness and swelling in triage  Denies fevers No relief with benadryl cream

## 2023-02-10 NOTE — ED Notes (Signed)
Pt stated, "couple of days ago I was outside and went in and noticed my back was big and sore. Hurts more when laying down." Denied N/V

## 2023-02-19 ENCOUNTER — Telehealth: Payer: Self-pay

## 2023-02-19 NOTE — Telephone Encounter (Signed)
Pt called requesting Rx refill for androgel.

## 2023-02-22 ENCOUNTER — Telehealth: Payer: Self-pay | Admitting: Internal Medicine

## 2023-02-22 NOTE — Telephone Encounter (Signed)
Phoned the pt and spoke with him regarding his hemorrhoids. I advised the pt that he needed to be evaluated because of his external hemorrhoids and he has not been seen since Oct.2023. pt was advised to contact his PCP to be seen since he could not schedule with [place] now. Advised pt strongly that we would need to evaluate him. Pt expressed understanding. Did not want to schedule now states he will check back next week regarding an appt

## 2023-02-22 NOTE — Telephone Encounter (Signed)
Patient came into the office asking if you could call in the hemorrhoid cream... [facility] on [address].  He said he knows he needs an appointment and will come back by next week to schedule.

## 2023-03-01 ENCOUNTER — Ambulatory Visit (INDEPENDENT_AMBULATORY_CARE_PROVIDER_SITE_OTHER): Payer: Medicaid Other | Admitting: Gastroenterology

## 2023-03-01 ENCOUNTER — Encounter: Payer: Self-pay | Admitting: Gastroenterology

## 2023-03-01 DIAGNOSIS — K641 Second degree hemorrhoids: Secondary | ICD-10-CM

## 2023-03-01 MED ORDER — HYDROCORTISONE (PERIANAL) 2.5 % EX CREA: 1.0000 | TOPICAL_CREAM | Freq: Two times a day (BID) | CUTANEOUS | 2 refills | Status: AC

## 2023-03-01 NOTE — Patient Instructions (Signed)
I recommend 2 teaspoons of Benefiber each morning in the beverage of your choice and Miralax 1 capful daily to keep stools soft. If you start having diarrhea, you can decrease the dosage of Miralax to 1/2 cap or every other day.  I will see you back in 2 weeks!  Please call with any concerns!  It was a pleasure to see you today. I want to create trusting relationships with patients and provide genuine, compassionate, and quality care. I truly value your feedback, so please be on the lookout for a survey regarding your visit with me today. I appreciate your time in completing this!         Gelene Mink, PhD, ANP-BC [facility]

## 2023-03-01 NOTE — Progress Notes (Signed)
Gastroenterology Office Note     Primary Care Physician:  [name], FNP  Primary Gastroenterologist: Dr. Marletta Lor    Chief Complaint   Chief Complaint  Patient presents with   Hemorrhoids    Patient here today due to having issues with bleeding and painful hemorrhoids.     History of Present Illness   Neil [name] is a 62 y.o. male presenting today with a history of hyperplastic polyps in 2013, hemorrhoids, and desire to discuss banding today. Cologuard negative in Nov 2023.    Last colonoscopy 2013 with few small hyperplastic polyps removed. In rectum was a suture with possible granulation tissue surrounding. Pathology in rectum where possible polyp at suture was hyperplastic.  No family history of colorectal malignancy.   He feels pressure right sided rectum. He has noted discomfort starting a few years ago. Some prolapsing but resolves on own. Bleeding for several years intermittently. Used hydrocortisone cream with good results. Notes BM 1-2 times per day but has prolonged toilet time and some straining.   He is not eager to pursue colonoscopy as recent negative Cologuard. He is very eager to have hemorrhoids addressed if possible.        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   Tobacco abuse     Past Surgical History:  Procedure Laterality Date   COLONOSCOPY  12/2011   Ascending and sigmoid diverticulosis, 2 polyps in rectosigmoid colon, possible polyp in rectum biopsied internal hemorrhoids, normal TI.  Pathology revealed hyperplastic  polyps.  Recommended 10-year repeat.   CRANIOTOMY N/A 12/07/2016   Procedure: TRANSPHENOIDAL RESECTION OF TUMOR;  Surgeon: [name], MD;  Location: [facility] OR;  Service: Neurosurgery;  Laterality: N/A;  TRANSPHENOIDAL RESECTION OF TUMOR   HEMORROIDECTOMY  2007   TRANSNASAL APPROACH N/A 12/07/2016   Procedure: TRANSNASAL APPROACH;  Surgeon: [name], MD;  Location: [facility] OR;  Service: ENT;  Laterality: N/A;    Current Outpatient Medications  Medication Sig Dispense Refill   albuterol (VENTOLIN HFA) 108 (90 Base) MCG/ACT inhaler Inhale 1-2 puffs into the lungs every 6 (six) hours as needed for wheezing or shortness of breath. 1 each 0   atorvastatin (LIPITOR) 10 MG tablet Take 1 tablet (10 mg total) by mouth at bedtime. 90 tablet 1   Cholecalciferol (VITAMIN D3) 125 MCG (5000 UT) CAPS TAKE 1 CAPSULE BY MOUTH DAILY 90 capsule 1   hydrocortisone (CORTEF) 10 MG tablet TAKE 1 TABLET BY MOUTH EVERY MORNING AND 1/2 TABLET EVERY EVENING 165 tablet 1   hydrocortisone cream 1 % Apply to affected area 2 times daily for 7 days. 20 g 0   levothyroxine (SYNTHROID) 100 MCG tablet TAKE 1 TABLET(100 MCG) BY MOUTH DAILY BEFORE BREAKFAST 90 tablet 1   Multiple Vitamin (MULTIVITAMIN WITH MINERALS) TABS tablet Take 1 tablet by mouth daily.     Naphazoline HCl (CLEAR EYES OP) Apply 1 drop to eye daily as needed (dry eyes).     sodium  chloride (OCEAN) 0.65 % SOLN nasal spray Place 1 spray into both nostrils as needed for congestion. 88 mL 0   Testosterone 1.62 % GEL APPLY 1 PUMP ON EACH SHOULDER EVERY MORNING 75 g 2   hydrocortisone (ANUSOL-HC) 2.5 % rectal cream Place 1 Application rectally 2 (two) times daily. 30 g 2   No current facility-administered medications for this visit.    Allergies as of 03/01/2023 - Review Complete 03/01/2023  Allergen Reaction Noted   No known allergies  12/06/2016    Family History  Problem Relation Age of Onset   Hypertension Mother    Stroke Mother    Mental  retardation Mother    Heart failure Father    Hypertension Sister    Bipolar disorder Son    Schizophrenia Son    Cancer - Colon Neg Hx        Is pretty sure his dad did NOT have colon CA but not 100% sure   Gastric cancer Neg Hx    Esophageal cancer Neg Hx     Social History   Socioeconomic History   Marital status: Single    Spouse name: Not on file   Number of children: 2   Years of education: 12   Highest education level: Not on file  Occupational History   Occupation: unemployed carpentry/upholstery  Tobacco Use   Smoking status: Every Day    Current packs/day: 0.50    Average packs/day: 0.5 packs/day for 40.0 years (20.0 ttl pk-yrs)    Types: Cigarettes   Smokeless tobacco: Never  Vaping Use   Vaping status: Never Used  Substance and Sexual Activity   Alcohol use: No    Alcohol/week: 0.0 standard drinks of alcohol   Drug use: Never    Comment: denies use 01/16/18   Sexual activity: Not Currently    Birth control/protection: None  Other Topics Concern   Not on file  Social History Narrative   Born in [place], [place]   Has lived in [place] for years.   Currently lives with maternal uncle, who also smokes, but is trying to get his own place.     Working on disability.   Social Determinants of Health   Financial Resource Strain: Not on file  Food Insecurity: Not on file  Transportation Needs: Not on file  Physical Activity: Not on file  Stress: Not on file  Social Connections: Not on file  Intimate Partner Violence: Not on file     Review of Systems   Gen: Denies any fever, chills, fatigue, weight loss, lack of appetite.  CV: Denies chest pain, heart palpitations, peripheral edema, syncope.  Resp: Denies shortness of breath at rest or with exertion. Denies wheezing or cough.  GI: Denies dysphagia or odynophagia. Denies jaundice, hematemesis, fecal incontinence. GU : Denies urinary burning, urinary frequency, urinary hesitancy MS: Denies joint pain,  muscle weakness, cramps, or limitation of movement.  Derm: Denies rash, itching, dry skin Psych: Denies depression, anxiety, memory loss, and confusion Heme: Denies bruising, bleeding, and enlarged lymph nodes.   Physical Exam   BP 134/88 (BP Location: Left Arm, Patient Position: Sitting, Cuff Size: Normal)   Pulse 68   Temp (!) 97.1 F (36.2 C) (Temporal)   Ht 5\' 8"  (1.727 m)   Wt 159 lb 6.4 oz (72.3 kg)   BMI 24.24 kg/m  General:   Alert and oriented. Pleasant and cooperative. Well-nourished and well-developed.  Head:  Normocephalic and atraumatic. Eyes:  Without icterus Abdomen:  +BS, soft,  non-tender and non-distended. No HSM noted. No guarding or rebound. No masses appreciated.  Rectal:  see hemorrhoid banding note Msk:  Symmetrical without gross deformities. Normal posture. Extremities:  Without edema. Neurologic:  Alert and  oriented x4;  grossly normal neurologically. Skin:  Intact without significant lesions or rashes. Psych:  Alert and cooperative. Normal mood and affect.     [facility] BANDING PROCEDURE NOTE   The patient presents with symptomatic grade 2 hemorrhoids, unresponsive to maximal medical therapy, requesting rubber band ligation of his hemorrhoidal disease. All risks, benefits, and alternative forms of therapy were described and informed consent was obtained.  In the left lateral decubitus position, DRE was performed initially. No mass and no discomfort. Anoscopic examination revealed grade 2 hemorrhoids in the right anterior position predominantly. He had left lateral granulation tissue, which seemed to be described previously at time of colonoscopy were suture material had been.   The decision was made to band the right anterior internal hemorrhoid, and the [facility] System was used to perform band ligation without complication. Digital anorectal examination was then performed to assure proper positioning of the band, and to adjust the banded tissue as  required. The patient was discharged home without pain or other issues. Dietary and behavioral recommendations were given, along with follow-up instructions. The patient will return in several weeks for followup and possible additional banding as required.  No complications were encountered and the patient tolerated the procedure well.      Assessment   Neil [name] is a 62 y.o. male presenting today with a history of symptomatic hemorrhoids, remote hemorrhoidectomy in 2007, desiring hemorrhoid banding today.   We banded right anterior position with good results. Last colonoscopy in 2013 with hyperplastic polyps.  Cologuard recently negative. He has desired to avoid colonoscopy if necessary. He tells me he does not believe he has any family history of colon cancer, but his dad did have some type of cancer and unsure what it was.   I do think colonoscopy would be helpful in light of bleeding; however, we can discuss this again at next visit. Will repeat anoscopy at next visit.     PLAN   Completed banding today Benefiber daily. Miralax daily to keep stool soft 2 week follow-up   Gelene Mink, PhD, [facility]astroenter[facility]

## 2023-03-20 ENCOUNTER — Ambulatory Visit: Payer: Medicaid Other | Admitting: Gastroenterology

## 2023-03-22 ENCOUNTER — Ambulatory Visit: Payer: Medicaid Other | Admitting: Gastroenterology

## 2023-03-22 ENCOUNTER — Encounter: Payer: Self-pay | Admitting: Gastroenterology

## 2023-03-22 VITALS — BP 125/83 | HR 61 | Temp 97.5°F | Ht 68.0 in | Wt 158.8 lb

## 2023-03-22 DIAGNOSIS — Z8719 Personal history of other diseases of the digestive system: Secondary | ICD-10-CM | POA: Diagnosis not present

## 2023-03-22 DIAGNOSIS — Z860102 Personal history of hyperplastic colon polyps: Secondary | ICD-10-CM | POA: Diagnosis not present

## 2023-03-22 DIAGNOSIS — K5900 Constipation, unspecified: Secondary | ICD-10-CM

## 2023-03-22 DIAGNOSIS — Z09 Encounter for follow-up examination after completed treatment for conditions other than malignant neoplasm: Secondary | ICD-10-CM | POA: Diagnosis not present

## 2023-03-22 NOTE — Progress Notes (Signed)
Gastroenterology Office Note     Primary Care Physician:  [name], FNP  Primary Gastroenterologist: Dr. Marletta Lor    Chief Complaint   Chief Complaint  Patient presents with   Hemorrhoids    Banding     History of Present Illness   Neil [name] is a 62 y.o. male presenting today with a history of hyperplastic polyps in 2013, hemorrhoids, banding of right anterior at last appt. He is here for follow-up and potential banding.  He notes complete resolution of symptoms. He does not want further banding. No further pressure or prolapsing. No rectal bleeding. BM 2-3 times per day. Eats oatmeal with good results. No straining. No abdominal pain. Cologuard negative in Nov 2023. Would like to hold off on colonoscopy right now but will call if he wants to pursue.        Past Medical History:  Diagnosis Date   Adrenal insufficiency (HCC)    Anemia 03/13/2017   Anxiety    Bipolar disorder [facility]) age 57 yo   Chest pain    Depression    Dyslipidemia, goal LDL below 100    Headache    Hypogonadism in male 03/13/2017   Hypothyroidism    Panhypopituitarism (HCC)    Schizophrenia 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   Tobacco abuse     Past Surgical History:  Procedure Laterality Date   COLONOSCOPY  12/2011   Ascending and sigmoid diverticulosis, 2 polyps in rectosigmoid colon, possible polyp in rectum biopsied internal hemorrhoids, normal TI.  Pathology revealed hyperplastic polyps.  Recommended 10-year repeat.   CRANIOTOMY N/A 12/07/2016   Procedure: TRANSPHENOIDAL RESECTION OF TUMOR;  Surgeon: [name], MD;  Location: [facility] OR;  Service: Neurosurgery;  Laterality: N/A;  TRANSPHENOIDAL RESECTION OF TUMOR   HEMORROIDECTOMY  2007   TRANSNASAL APPROACH N/A 12/07/2016   Procedure: TRANSNASAL APPROACH;  Surgeon: [name], MD;  Location: [facility] OR;   Service: ENT;  Laterality: N/A;    Current Outpatient Medications  Medication Sig Dispense Refill   albuterol (VENTOLIN HFA) 108 (90 Base) MCG/ACT inhaler Inhale 1-2 puffs into the lungs every 6 (six) hours as needed for wheezing or shortness of breath. 1 each 0   atorvastatin (LIPITOR) 10 MG tablet Take 1 tablet (10 mg total) by mouth at bedtime. 90 tablet 1   Cholecalciferol (VITAMIN D3) 125 MCG (5000 UT) CAPS TAKE 1 CAPSULE BY MOUTH DAILY 90 capsule 1   hydrocortisone (ANUSOL-HC) 2.5 % rectal cream Place 1 Application rectally 2 (two) times daily. 30 g 2   hydrocortisone (CORTEF) 10 MG tablet TAKE 1 TABLET BY MOUTH EVERY MORNING AND 1/2 TABLET EVERY EVENING 165 tablet 1   levothyroxine (SYNTHROID) 100 MCG tablet TAKE 1 TABLET(100 MCG) BY MOUTH DAILY BEFORE BREAKFAST 90 tablet 1   Multiple Vitamin (MULTIVITAMIN WITH MINERALS) TABS tablet Take 1 tablet by mouth daily.     Naphazoline HCl (CLEAR EYES OP) Apply 1 drop to eye daily as needed (dry eyes).     oxyCODONE-acetaminophen (PERCOCET) 10-325 MG tablet Take 1 tablet every 8 hours by oral route as needed for 30 days, for chronic pain.     sodium chloride (OCEAN) 0.65 % SOLN nasal spray Place 1 spray into both nostrils as needed for congestion. 88 mL 0   Testosterone 1.62 % GEL APPLY 1 PUMP ON EACH SHOULDER EVERY MORNING 75 g  2   No current facility-administered medications for this visit.    Allergies as of 03/22/2023 - Review Complete 03/22/2023  Allergen Reaction Noted   No known allergies  12/06/2016    Family History  Problem Relation Age of Onset   Hypertension Mother    Stroke Mother    Mental retardation Mother    Heart failure Father    Hypertension Sister    Bipolar disorder Son    Schizophrenia Son    Cancer - Colon Neg Hx        Is pretty sure his dad did NOT have colon CA but not 100% sure   Gastric cancer Neg Hx    Esophageal cancer Neg Hx     Social History   Socioeconomic History   Marital status: Single     Spouse name: Not on file   Number of children: 2   Years of education: 12   Highest education level: Not on file  Occupational History   Occupation: unemployed carpentry/upholstery  Tobacco Use   Smoking status: Every Day    Current packs/day: 0.50    Average packs/day: 0.5 packs/day for 40.0 years (20.0 ttl pk-yrs)    Types: Cigarettes   Smokeless tobacco: Never  Vaping Use   Vaping status: Never Used  Substance and Sexual Activity   Alcohol use: No    Alcohol/week: 0.0 standard drinks of alcohol   Drug use: Never    Comment: denies use 01/16/18   Sexual activity: Not Currently    Birth control/protection: None  Other Topics Concern   Not on file  Social History Narrative   Born in [place], [place]   Has lived in [place] for years.   Currently lives with maternal uncle, who also smokes, but is trying to get his own place.     Working on disability.   Social Determinants of Health   Financial Resource Strain: Not on file  Food Insecurity: Not on file  Transportation Needs: Not on file  Physical Activity: Not on file  Stress: Not on file  Social Connections: Not on file  Intimate Partner Violence: Not on file     Review of Systems   Gen: Denies any fever, chills, fatigue, weight loss, lack of appetite.  CV: Denies chest pain, heart palpitations, peripheral edema, syncope.  Resp: Denies shortness of breath at rest or with exertion. Denies wheezing or cough.  GI: Denies dysphagia or odynophagia. Denies jaundice, hematemesis, fecal incontinence. GU : Denies urinary burning, urinary frequency, urinary hesitancy MS: Denies joint pain, muscle weakness, cramps, or limitation of movement.  Derm: Denies rash, itching, dry skin Psych: Denies depression, anxiety, memory loss, and confusion Heme: Denies bruising, bleeding, and enlarged lymph nodes.   Physical Exam   BP 125/83 (BP Location: Right Arm, Patient Position: Sitting, Cuff Size: Normal)   Pulse 61   Temp (!)  97.5 F (36.4 C) (Oral)   Ht 5\' 8"  (1.727 m)   Wt 158 lb 12.8 oz (72 kg)   SpO2 99%   BMI 24.15 kg/m  General:   Alert and oriented. Pleasant and cooperative. Well-nourished and well-developed.  Head:  Normocephalic and atraumatic. Eyes:  Without icterus Abdomen:  +BS, soft, non-tender and non-distended. No HSM noted. No guarding or rebound. No masses appreciated.  Rectal:  Deferred  Msk:  Symmetrical without gross deformities. Normal posture. Extremities:  Without edema. Neurologic:  Alert and  oriented x4;  grossly normal neurologically. Skin:  Intact without significant lesions or rashes. Psych:  Alert and cooperative. Normal mood and affect.   Assessment   Neil [name] is a 62 y.o. male presenting today with a history of hyperplastic polyps in 2013, hemorrhoids, banding of right anterior at last appt, returning for follow-up.  He has had complete resolution of symptoms. Last colonoscopy with hyperplastic polyps in 2013. Cologuard negative in Nov 2023. We had discussed colonoscopy, but he would like to hold off for now. I did discuss with him that he appeared to have left lateral granulation tissue on anoscopy at last visit that was also seen on last colonoscopy felt secondary to suture material. He will consider colonoscopy in future and call us if he changes his mind. He can be triaged if needed.   PLAN    Continue high fiber diet Call if any concerns, rectal bleeding, changes in bowel habits Return prn    Gelene Mink, PhD, [facility]

## 2023-03-22 NOTE — Patient Instructions (Addendum)
Continue a high fiber diet as you are doing!  We don't have Miralax samples, but you can take 1 capful of this in beverage of choice daily if you feel you need a better bowel movement.  Please call with any concerns or if you would like to pursue a colonoscopy!  I enjoyed seeing you again today! I value our relationship and want to provide genuine, compassionate, and quality care. You may receive a survey regarding your visit with me, and I welcome your feedback! Thanks so much for taking the time to complete this. I look forward to seeing you again.      Gelene Mink, PhD, ANP-BC [facility] Gastroenterology

## 2023-04-09 ENCOUNTER — Other Ambulatory Visit: Payer: Self-pay | Admitting: "Endocrinology

## 2023-04-09 DIAGNOSIS — E039 Hypothyroidism, unspecified: Secondary | ICD-10-CM

## 2023-04-16 LAB — COMPREHENSIVE METABOLIC PANEL
ALT: 15 [IU]/L (ref 0–44)
AST: 19 [IU]/L (ref 0–40)
Albumin: 4.7 g/dL (ref 3.9–4.9)
Alkaline Phosphatase: 71 [IU]/L (ref 44–121)
BUN/Creatinine Ratio: 10 (ref 10–24)
BUN: 14 mg/dL (ref 8–27)
Bilirubin Total: 0.4 mg/dL (ref 0.0–1.2)
CO2: 22 mmol/L (ref 20–29)
Calcium: 10.2 mg/dL (ref 8.6–10.2)
Chloride: 105 mmol/L (ref 96–106)
Creatinine, Ser: 1.42 mg/dL — ABNORMAL HIGH (ref 0.76–1.27)
Globulin, Total: 2.2 g/dL (ref 1.5–4.5)
Glucose: 89 mg/dL (ref 70–99)
Potassium: 4.8 mmol/L (ref 3.5–5.2)
Sodium: 141 mmol/L (ref 134–144)
Total Protein: 6.9 g/dL (ref 6.0–8.5)
eGFR: 56 mL/min/{1.73_m2} — ABNORMAL LOW (ref >59)

## 2023-04-16 LAB — TESTOSTERONE, FREE, TOTAL, SHBG
Sex Hormone Binding: 41.2 nmol/L (ref 19.3–76.4)
Testosterone: 76 ng/dL — ABNORMAL LOW (ref 264–916)

## 2023-04-16 LAB — T4, FREE: Free T4: 1.27 ng/dL (ref 0.82–1.77)

## 2023-04-18 ENCOUNTER — Ambulatory Visit (INDEPENDENT_AMBULATORY_CARE_PROVIDER_SITE_OTHER): Payer: Medicaid Other | Admitting: "Endocrinology

## 2023-04-18 ENCOUNTER — Encounter: Payer: Self-pay | Admitting: "Endocrinology

## 2023-04-18 VITALS — BP 116/78 | HR 68 | Ht 68.0 in | Wt 158.4 lb

## 2023-04-18 DIAGNOSIS — E2740 Unspecified adrenocortical insufficiency: Secondary | ICD-10-CM | POA: Diagnosis not present

## 2023-04-18 DIAGNOSIS — E230 Hypopituitarism: Secondary | ICD-10-CM

## 2023-04-18 DIAGNOSIS — E039 Hypothyroidism, unspecified: Secondary | ICD-10-CM | POA: Diagnosis not present

## 2023-04-18 DIAGNOSIS — E782 Mixed hyperlipidemia: Secondary | ICD-10-CM

## 2023-04-18 DIAGNOSIS — D352 Benign neoplasm of pituitary gland: Secondary | ICD-10-CM

## 2023-04-18 DIAGNOSIS — E291 Testicular hypofunction: Secondary | ICD-10-CM

## 2023-04-18 MED ORDER — TESTOSTERONE 1.62 % TD GEL: TRANSDERMAL | 2 refills | Status: DC

## 2023-04-18 MED ORDER — HYDROCORTISONE 10 MG PO TABS: ORAL_TABLET | ORAL | 1 refills | Status: DC
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
## 2023-07-20 LAB — LIPID PANEL
Chol/HDL Ratio: 4.4 {ratio} (ref 0.0–5.0)
Cholesterol, Total: 164 mg/dL (ref 100–199)
HDL: 37 mg/dL — ABNORMAL LOW (ref >39)
LDL Chol Calc (NIH): 109 mg/dL — ABNORMAL HIGH (ref 0–99)
Triglycerides: 98 mg/dL (ref 0–149)
VLDL Cholesterol Cal: 18 mg/dL (ref 5–40)

## 2023-07-20 LAB — COMPREHENSIVE METABOLIC PANEL
ALT: 15 [IU]/L (ref 0–44)
AST: 21 [IU]/L (ref 0–40)
Albumin: 4.3 g/dL (ref 3.9–4.9)
Alkaline Phosphatase: 54 [IU]/L (ref 44–121)
BUN/Creatinine Ratio: 7 — ABNORMAL LOW (ref 10–24)
BUN: 10 mg/dL (ref 8–27)
Bilirubin Total: 0.6 mg/dL (ref 0.0–1.2)
CO2: 22 mmol/L (ref 20–29)
Calcium: 9.6 mg/dL (ref 8.6–10.2)
Chloride: 106 mmol/L (ref 96–106)
Creatinine, Ser: 1.37 mg/dL — ABNORMAL HIGH (ref 0.76–1.27)
Globulin, Total: 2 g/dL (ref 1.5–4.5)
Glucose: 85 mg/dL (ref 70–99)
Potassium: 4.6 mmol/L (ref 3.5–5.2)
Sodium: 140 mmol/L (ref 134–144)
Total Protein: 6.3 g/dL (ref 6.0–8.5)
eGFR: 58 mL/min/{1.73_m2} — ABNORMAL LOW (ref >59)

## 2023-07-20 LAB — T4, FREE: Free T4: 1.37 ng/dL (ref 0.82–1.77)

## 2023-07-20 LAB — PSA: Prostate Specific Ag, Serum: 0.6 ng/mL (ref 0.0–4.0)

## 2023-07-20 LAB — TESTOSTERONE, FREE, TOTAL, SHBG
Sex Hormone Binding: 35.4 nmol/L (ref 19.3–76.4)
Testosterone, Free: 1.7 pg/mL — ABNORMAL LOW (ref 6.6–18.1)
Testosterone: 252 ng/dL — ABNORMAL LOW (ref 264–916)

## 2023-07-20 LAB — PROLACTIN: Prolactin: 10.1 ng/mL (ref 3.6–25.2)

## 2023-07-25 ENCOUNTER — Telehealth: Payer: Self-pay | Admitting: "Endocrinology

## 2023-07-25 DIAGNOSIS — E230 Hypopituitarism: Secondary | ICD-10-CM

## 2023-07-25 DIAGNOSIS — D352 Benign neoplasm of pituitary gland: Secondary | ICD-10-CM

## 2023-07-25 MED ORDER — HYDROCORTISONE 10 MG PO TABS: ORAL_TABLET | ORAL | 0 refills | Status: DC

## 2023-07-25 NOTE — Telephone Encounter (Signed)
Rx refill for Cortef 10mg  po at 8am and 5mg  at noon sent to [facility] on [address]

## 2023-07-25 NOTE — Telephone Encounter (Signed)
Pt called and is asking for a refill on his Cortef to [facility] on [address]. Patient has an appt Friday 2.14

## 2023-07-27 ENCOUNTER — Ambulatory Visit (INDEPENDENT_AMBULATORY_CARE_PROVIDER_SITE_OTHER): Payer: Medicaid Other | Admitting: "Endocrinology

## 2023-07-27 ENCOUNTER — Encounter: Payer: Self-pay | Admitting: "Endocrinology

## 2023-07-27 VITALS — BP 110/76 | HR 72 | Ht 68.0 in | Wt 161.8 lb

## 2023-07-27 DIAGNOSIS — E039 Hypothyroidism, unspecified: Secondary | ICD-10-CM | POA: Diagnosis not present

## 2023-07-27 DIAGNOSIS — F17200 Nicotine dependence, unspecified, uncomplicated: Secondary | ICD-10-CM

## 2023-07-27 DIAGNOSIS — E291 Testicular hypofunction: Secondary | ICD-10-CM

## 2023-07-27 DIAGNOSIS — E782 Mixed hyperlipidemia: Secondary | ICD-10-CM | POA: Diagnosis not present

## 2023-07-27 DIAGNOSIS — E2740 Unspecified adrenocortical insufficiency: Secondary | ICD-10-CM

## 2023-07-27 DIAGNOSIS — E230 Hypopituitarism: Secondary | ICD-10-CM | POA: Diagnosis not present
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
## 2023-10-15 ENCOUNTER — Emergency Department (HOSPITAL_COMMUNITY)
Admission: EM | Admit: 2023-10-15 | Discharge: 2023-10-15 | Discharge status: Against Medical Advice | Attending: Emergency Medicine | Admitting: Emergency Medicine

## 2023-10-15 ENCOUNTER — Other Ambulatory Visit (HOSPITAL_COMMUNITY): Payer: Self-pay | Admitting: Family Medicine

## 2023-10-15 ENCOUNTER — Encounter (HOSPITAL_COMMUNITY): Payer: Self-pay | Admitting: Emergency Medicine

## 2023-10-15 ENCOUNTER — Other Ambulatory Visit: Payer: Self-pay

## 2023-10-15 DIAGNOSIS — R0989 Other specified symptoms and signs involving the circulatory and respiratory systems: Secondary | ICD-10-CM | POA: Diagnosis not present

## 2023-10-15 DIAGNOSIS — R067 Sneezing: Secondary | ICD-10-CM | POA: Diagnosis present

## 2023-10-15 DIAGNOSIS — F17200 Nicotine dependence, unspecified, uncomplicated: Secondary | ICD-10-CM

## 2023-10-15 DIAGNOSIS — Z5321 Procedure and treatment not carried out due to patient leaving prior to being seen by health care provider: Secondary | ICD-10-CM | POA: Diagnosis not present

## 2023-10-15 NOTE — ED Triage Notes (Signed)
 Pt with c/o sneezing, chest congestion since yesterday.

## 2023-10-16 ENCOUNTER — Ambulatory Visit
Admission: EM | Admit: 2023-10-16 | Discharge: 2023-10-16 | Disposition: A | Discharge status: Home / Self Care | Attending: Nurse Practitioner | Admitting: Nurse Practitioner

## 2023-10-16 ENCOUNTER — Encounter: Payer: Self-pay | Admitting: Emergency Medicine

## 2023-10-16 DIAGNOSIS — J069 Acute upper respiratory infection, unspecified: Secondary | ICD-10-CM

## 2023-10-16 MED ORDER — BENZONATATE 100 MG PO CAPS: 100.0000 mg | ORAL_CAPSULE | Freq: Three times a day (TID) | ORAL | 0 refills | Status: AC | PRN

## 2023-10-16 NOTE — ED Triage Notes (Signed)
 Cough and congestion x 2 days.  Has been taking dayquil and nyquil with some relief

## 2023-10-16 NOTE — Discharge Instructions (Addendum)
 You have a viral upper respiratory infection.  Symptoms should improve over the next week to 10 days.  If you develop chest pain or shortness of breath, go to the emergency room.  Some things that can make you feel better are: - Increased rest - Increasing fluid with water/sugar free electrolytes - Acetaminophen and ibuprofen as needed for fever/pain - Salt water gargling, chloraseptic spray and throat lozenges - OTC guaifenesin (Mucinex) 600 mg twice daily for congestion - Saline sinus flushes or a neti pot - Humidifying the air -Tessalon Perles every 8 hours as needed for dry cough

## 2023-10-16 NOTE — ED Provider Notes (Signed)
 [facility]    CSN: [id number] Arrival date & time: 10/16/23  1559      History   Chief Complaint No chief complaint on file.   HPI Neil [name] is a 63 y.o. male.   Patient presents today with 2 day history of dry cough, runny and stuffy nose, headache with coughing, and fatigue.  He denies fever, body aches, chills, shortness of breath or chest pain, sore throat, ear pain, abdominal pain, nausea/vomiting, diarrhea, and change in appetite.  Significant other was sick with similar symptoms prior to his symptoms beginning.  Has been taking Dayquil, Nyquil, and Mucinex for symptoms with temporary improvement.    Patient denies history of lung disease.  He is a current smoker of about 0.5 ppd of cigarettes for more than 30 years.    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  Pathology revealed hyperplastic polyps.  Recommended 10-year repeat.   CRANIOTOMY N/A 12/07/2016   Procedure: TRANSPHENOIDAL RESECTION OF TUMOR;  Surgeon: Garry Kansas, MD;  Location: [facility] OR;  Service: Neurosurgery;  Laterality: N/A;  TRANSPHENOIDAL RESECTION OF TUMOR   HEMORROIDECTOMY  2007   TRANSNASAL APPROACH N/A 12/07/2016   Procedure: TRANSNASAL APPROACH;  Surgeon: Prescott Brodie, MD;  Location: [facility] OR;  Service: ENT;  Laterality: N/A;       Home Medications    Prior to Admission medications   Medication Sig Start Date End Date Taking? Authorizing Provider  benzonatate  (TESSALON ) 100 MG capsule Take 1 capsule (100 mg total) by mouth 3 (three) times daily as needed for cough. Do not take with alcohol or while operating or driving heavy machinery 4/0/98  Yes Wilhemena Harbour, NP  albuterol  (VENTOLIN  HFA) 108 (90 Base) MCG/ACT inhaler Inhale 1-2 puffs into the lungs every 6 (six) hours as needed for wheezing or shortness of breath. 07/26/21   Graham, Laura E, PA-C  atorvastatin  (LIPITOR) 10 MG tablet Take 1 tablet (10 mg total)  by mouth at bedtime. 12/12/22   Nida, Gebreselassie W, MD  Cholecalciferol (VITAMIN D3) 125 MCG (5000 UT) CAPS TAKE 1 CAPSULE BY MOUTH DAILY 05/27/21   Nida, Gebreselassie W, MD  hydrocortisone  (ANUSOL -HC) 2.5 % rectal cream Place 1 Application rectally 2 (two) times daily. 03/01/23   Delman Ferns, NP  hydrocortisone  (CORTEF ) 10 MG tablet TAKE 1 TABLET BY MOUTH EVERY MORNING AND 1/2 TABLET EVERY EVENING 07/25/23   Nida, Gebreselassie W, MD  levothyroxine  (SYNTHROID ) 100 MCG tablet TAKE 1  TABLET(100 MCG) BY MOUTH DAILY BEFORE BREAKFAST 04/09/23   Nida, Gebreselassie W, MD  Multiple Vitamin (MULTIVITAMIN WITH MINERALS) TABS tablet Take 1 tablet by mouth daily.    [provider]  Naphazoline HCl (CLEAR EYES OP) Apply 1 drop to eye daily as needed (dry eyes).    [provider]  oxyCODONE -acetaminophen  (PERCOCET) 10-325 MG tablet Take 1 tablet every 8 hours by oral route as needed for 30 days, for chronic pain. 12/26/22   [provider]  sodium chloride  (OCEAN) 0.65 % SOLN nasal spray Place 1 spray into both nostrils as needed for congestion. 01/30/22   Wilhemena Harbour, NP  Testosterone  1.62 % GEL APPLY 2 PUMP ACTS ON EACH SHOULDER EVERY MORNING 04/18/23   Baby Bolt, MD    Family History Family History  Problem Relation Age of Onset   Hypertension Mother    Stroke Mother    Mental retardation Mother    Heart failure Father    Hypertension Sister    Bipolar disorder Son    Schizophrenia Son    Cancer - Colon Neg Hx        Is pretty sure his dad did NOT have colon CA but not 100% sure   Gastric cancer Neg Hx    Esophageal cancer Neg Hx     Social History Social History   Tobacco Use   Smoking status: Every Day    Current packs/day: 0.50    Average packs/day: 0.5 packs/day for 40.0 years (20.0 ttl pk-yrs)    Types: Cigarettes   Smokeless tobacco: Never  Vaping Use   Vaping status: Never Used  Substance Use Topics   Alcohol use: No    Alcohol/week: 0.0 standard drinks of alcohol   Drug use: Never    Comment: denies use 01/16/18     Allergies   No known allergies   Review of Systems Review of Systems Per HPI  Physical Exam Triage Vital Signs ED Triage Vitals  Encounter Vitals Group     BP 10/16/23 1606 127/85     Systolic BP Percentile --      Diastolic BP Percentile --      Pulse Rate 10/16/23 1606 84     Resp 10/16/23 1606 18     Temp 10/16/23 1606 (!) 97.5 F (36.4 C)     Temp Source 10/16/23 1606 Oral      SpO2 10/16/23 1606 94 %     Weight --      Height --      Head Circumference --      Peak Flow --      Pain Score 10/16/23 1607 0     Pain Loc --      Pain Education --      Exclude from Growth Chart --    No data found.  Updated Vital Signs BP 127/85 (BP Location: Right Arm)   Pulse 84   Temp (!) 97.5 F (36.4 C) (Oral)  Resp 18   SpO2 94%   Visual Acuity Right Eye Distance:   Left Eye Distance:   Bilateral Distance:    Right Eye Near:   Left Eye Near:    Bilateral Near:     Physical Exam Vitals and nursing note reviewed.  Constitutional:      General: He is not in acute distress.    Appearance: Normal appearance. He is not ill-appearing or toxic-appearing.  HENT:     Head: Normocephalic and atraumatic.     Right Ear: Tympanic membrane, ear canal and external ear normal.     Left Ear: Tympanic membrane, ear canal and external ear normal.     Nose: No congestion or rhinorrhea.     Mouth/Throat:     Mouth: Mucous membranes are moist.     Pharynx: Oropharynx is clear. No oropharyngeal exudate or posterior oropharyngeal erythema.  Eyes:     General: No scleral icterus.    Extraocular Movements: Extraocular movements intact.  Cardiovascular:     Rate and Rhythm: Normal rate and regular rhythm.  Pulmonary:     Effort: Pulmonary effort is normal. No respiratory distress.     Breath sounds: Normal breath sounds. No wheezing, rhonchi or rales.  Musculoskeletal:     Cervical back: Normal range of motion and neck supple.  Lymphadenopathy:     Cervical: No cervical adenopathy.  Skin:    General: Skin is warm and dry.     Coloration: Skin is not jaundiced or pale.     Findings: No erythema or rash.  Neurological:     Mental Status: He is alert and oriented to person, place, and time.  Psychiatric:        Behavior: Behavior is cooperative.      UC Treatments / Results  Labs (all labs ordered are listed, but only abnormal results are displayed) Labs Reviewed  - No data to display  EKG   Radiology No results found.  Procedures Procedures (including critical care time)  Medications Ordered in UC Medications - No data to display  Initial Impression / Assessment and Plan / UC Course  I have reviewed the triage vital signs and the nursing notes.  Pertinent labs & imaging results that were available during my care of the patient were reviewed by me and considered in my medical decision making (see chart for details).   Patient is well-appearing, normotensive, afebrile, not tachycardic, not tachypneic, oxygenating well on room air.    1. Viral URI with cough Vitals and examination are reassuring today Suspect viral etiology, viral testing deferred today Supportive care discussed with patient including continue Mucinex and start cough Supressant medication ER and return precautions discussed with patient   The patient was given the opportunity to ask questions.  All questions answered to their satisfaction.  The patient is in agreement to this plan.   Final Clinical Impressions(s) / UC Diagnoses   Final diagnoses:  Viral URI with cough     Discharge Instructions      You have a viral upper respiratory infection.  Symptoms should improve over the next week to 10 days.  If you develop chest pain or shortness of breath, go to the emergency room.  Some things that can make you feel better are: - Increased rest - Increasing fluid with water/sugar free electrolytes - Acetaminophen  and ibuprofen  as needed for fever/pain - Salt water gargling, chloraseptic spray and throat lozenges - OTC guaifenesin (Mucinex) 600 mg twice daily for congestion - Saline  sinus flushes or a neti pot - Humidifying the air -Tessalon  Perles every 8 hours as needed for dry cough     ED Prescriptions     Medication Sig Dispense Auth. Provider   benzonatate  (TESSALON ) 100 MG capsule Take 1 capsule (100 mg total) by mouth 3 (three) times daily as needed for  cough. Do not take with alcohol or while operating or driving heavy machinery 21 capsule Wilhemena Harbour, NP      PDMP not reviewed this encounter.   Wilhemena Harbour, NP 10/16/23 [phone]

## 2023-10-28 ENCOUNTER — Ambulatory Visit
Admission: EM | Admit: 2023-10-28 | Discharge: 2023-10-28 | Disposition: A | Discharge status: Home / Self Care | Attending: Family Medicine | Admitting: Family Medicine

## 2023-10-28 DIAGNOSIS — L02214 Cutaneous abscess of groin: Secondary | ICD-10-CM

## 2023-10-28 MED ORDER — CHLORHEXIDINE GLUCONATE 4 % EX SOLN: Freq: Every day | CUTANEOUS | 0 refills | Status: AC | PRN

## 2023-10-28 MED ORDER — DOXYCYCLINE HYCLATE 100 MG PO CAPS: 100.0000 mg | ORAL_CAPSULE | Freq: Two times a day (BID) | ORAL | 0 refills | Status: AC

## 2023-10-28 MED ORDER — MUPIROCIN 2 % EX OINT: 1.0000 | TOPICAL_OINTMENT | Freq: Two times a day (BID) | CUTANEOUS | 0 refills | Status: AC

## 2023-10-28 NOTE — ED Triage Notes (Signed)
 Pt reports he has a boil on his groin area on the left side  x 2 days

## 2023-10-28 NOTE — ED Provider Notes (Signed)
 [facility]    CSN: [id number] Arrival date & time: 10/28/23  1043      History   Chief Complaint No chief complaint on file.   HPI Neil [name] is a 63 y.o. male.   Patient presenting today with a boil to the left groin region that he noticed 2 days ago.  States he has been trying to squeeze pus out and it is now draining thick green and yellow pus.  Denies fever, chills, nausea, vomiting, known injury to the area.  Not trying anything over-the-counter for symptoms.    Past Medical History:  Diagnosis Date   Adrenal insufficiency (HCC)    Anemia 03/13/2017   Anxiety    Bipolar disorder [facility]) age 50 yo   Chest pain    Depression    Dyslipidemia, goal LDL below 100    Headache    Hypogonadism in male 03/13/2017   Hypothyroidism    Panhypopituitarism (HCC)    Schizophrenia 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  Pathology revealed hyperplastic polyps.  Recommended 10-year repeat.   CRANIOTOMY N/A 12/07/2016   Procedure: TRANSPHENOIDAL RESECTION OF TUMOR;  Surgeon: Garry Kansas, MD;  Location: [facility] OR;  Service: Neurosurgery;  Laterality: N/A;  TRANSPHENOIDAL RESECTION OF TUMOR   HEMORROIDECTOMY  2007   TRANSNASAL APPROACH N/A 12/07/2016   Procedure: TRANSNASAL APPROACH;  Surgeon: Prescott Brodie, MD;  Location: [facility] OR;  Service: ENT;  Laterality: N/A;       Home Medications    Prior to Admission medications   Medication Sig Start Date End Date Taking? Authorizing Provider  chlorhexidine  (HIBICLENS ) 4 % external liquid Apply topically daily as needed. 10/28/23  Yes Corbin Dess, PA-C  doxycycline  (VIBRAMYCIN ) 100 MG capsule Take 1 capsule (100 mg total) by mouth 2 (two) times daily. 10/28/23  Yes Corbin Dess, PA-C  mupirocin ointment (BACTROBAN) 2 % Apply 1 Application topically 2 (two) times daily. 10/28/23  Yes Corbin Dess, PA-C  albuterol  (VENTOLIN  HFA) 108 [phone] Base) MCG/ACT inhaler Inhale 1-2 puffs into the lungs every 6 (six) hours as needed for wheezing or shortness of breath. 07/26/21   Graham, Laura E, PA-C  atorvastatin  (LIPITOR) 10 MG tablet Take 1 tablet (10 mg total) by mouth at bedtime. 12/12/22   Nida, Gebreselassie W, MD  benzonatate  (TESSALON ) 100 MG capsule  Take 1 capsule (100 mg total) by mouth 3 (three) times daily as needed for cough. Do not take with alcohol or while operating or driving heavy machinery 06/17/08   Wilhemena Harbour, NP  Cholecalciferol (VITAMIN D3) 125 MCG (5000 UT) CAPS TAKE 1 CAPSULE BY MOUTH DAILY 05/27/21   Nida, Gebreselassie W, MD  hydrocortisone  (ANUSOL -HC) 2.5 % rectal cream Place 1 Application rectally 2 (two) times daily. 03/01/23   Delman Ferns, NP  hydrocortisone  (CORTEF ) 10 MG tablet TAKE 1 TABLET BY MOUTH  EVERY MORNING AND 1/2 TABLET EVERY EVENING 07/25/23   Nida, Gebreselassie W, MD  levothyroxine  (SYNTHROID ) 100 MCG tablet TAKE 1 TABLET(100 MCG) BY MOUTH DAILY BEFORE BREAKFAST 04/09/23   Nida, Gebreselassie W, MD  Multiple Vitamin (MULTIVITAMIN WITH MINERALS) TABS tablet Take 1 tablet by mouth daily.    [provider]  Naphazoline HCl (CLEAR EYES OP) Apply 1 drop to eye daily as needed (dry eyes).    [provider]  oxyCODONE -acetaminophen  (PERCOCET) 10-325 MG tablet Take 1 tablet every 8 hours by oral route as needed for 30 days, for chronic pain. 12/26/22   [provider]  sodium chloride  (OCEAN) 0.65 % SOLN nasal spray Place 1 spray into both nostrils as needed for congestion. 01/30/22   Wilhemena Harbour, NP  Testosterone  1.62 % GEL APPLY 2 PUMP ACTS ON EACH SHOULDER EVERY MORNING 04/18/23   Baby Bolt, MD    Family History Family History  Problem Relation Age of Onset   Hypertension Mother    Stroke Mother    Mental retardation Mother    Heart failure Father    Hypertension Sister    Bipolar disorder Son    Schizophrenia Son    Cancer - Colon Neg Hx        Is pretty sure his dad did NOT have colon CA but not 100% sure   Gastric cancer Neg Hx    Esophageal cancer Neg Hx     Social History Social History   Tobacco Use   Smoking status: Every Day    Current packs/day: 0.50    Average packs/day: 0.5 packs/day for 40.0 years (20.0 ttl pk-yrs)    Types: Cigarettes   Smokeless tobacco: Never  Vaping Use   Vaping status: Never Used  Substance Use Topics   Alcohol use: No    Alcohol/week: 0.0 standard drinks of alcohol   Drug use: Never    Comment: denies use 01/16/18     Allergies   No known allergies   Review of Systems Review of Systems Per HPI  Physical Exam Triage Vital Signs ED Triage Vitals  Encounter Vitals Group     BP 10/28/23 1054 118/79     Systolic BP Percentile --      Diastolic BP Percentile --      Pulse Rate  10/28/23 1054 82     Resp 10/28/23 1054 (!) 0     Temp 10/28/23 1054 98 F (36.7 C)     Temp Source 10/28/23 1054 Oral     SpO2 10/28/23 1054 98 %     Weight --      Height --      Head Circumference --      Peak Flow --      Pain Score 10/28/23 1055 8     Pain Loc --      Pain Education --      Exclude from Growth Chart --    No data found.  Updated Vital Signs BP 118/79 (BP Location: Right Arm)   Pulse 82   Temp 98 F (36.7 C) (Oral)   Resp (!) 0   SpO2 98%   Visual Acuity Right Eye Distance:   Left Eye Distance:   Bilateral Distance:    Right Eye Near:   Left Eye Near:    Bilateral Near:     Physical Exam Vitals and nursing note reviewed.  Constitutional:      Appearance: Normal appearance.  HENT:     Head: Atraumatic.  Eyes:     Extraocular Movements: Extraocular movements intact.     Conjunctiva/sclera: Conjunctivae normal.  Cardiovascular:     Rate and Rhythm: Normal rate.  Pulmonary:     Effort: Pulmonary effort is normal.  Musculoskeletal:        General: Normal range of motion.     Cervical back: Normal range of motion and neck supple.  Skin:    General: Skin is warm.     Comments: Actively draining left groin abscess  Neurological:     General: No focal deficit present.     Mental Status: He is oriented to person, place, and time.  Psychiatric:        Mood and Affect: Mood normal.        Thought Content: Thought content normal.        Judgment: Judgment normal.      UC Treatments / Results  Labs (all labs ordered are listed, but only abnormal results are displayed) Labs Reviewed - No data to display  EKG   Radiology No results found.  Procedures Procedures (including critical care time)  Medications Ordered in UC Medications - No data to display  Initial Impression / Assessment and Plan / UC Course  I have reviewed the triage vital signs and the nursing notes.  Pertinent labs & imaging results that were available during my  care of the patient were reviewed by me and considered in my medical decision making (see chart for details).     Treat with warm compresses, doxycycline , Hibiclens , Bactroban, supportive home care.  Return for worsening symptoms.  Final Clinical Impressions(s) / UC Diagnoses   Final diagnoses:  Abscess of groin, left   Discharge Instructions   None    ED Prescriptions     Medication Sig Dispense Auth. Provider   doxycycline  (VIBRAMYCIN ) 100 MG capsule Take 1 capsule (100 mg total) by mouth 2 (two) times daily. 14 capsule Corbin Dess, PA-C   chlorhexidine  (HIBICLENS ) 4 % external liquid Apply topically daily as needed. 236 mL Corbin Dess, PA-C   mupirocin ointment (BACTROBAN) 2 % Apply 1 Application topically 2 (two) times daily. 22 g Corbin Dess, [place]      PDMP not reviewed this encounter.   Corbin Dess, [place] 10/28/23 1147

## 2023-11-19 ENCOUNTER — Ambulatory Visit (HOSPITAL_COMMUNITY): Admission: RE | Admit: 2023-11-19 | Source: Ambulatory Visit

## 2023-11-19 ENCOUNTER — Encounter (HOSPITAL_COMMUNITY): Payer: Self-pay

## 2023-11-22 LAB — TESTOSTERONE, FREE, TOTAL, SHBG
Sex Hormone Binding: 38.4 nmol/L (ref 19.3–76.4)
Testosterone: 157 ng/dL — ABNORMAL LOW (ref 264–916)

## 2023-11-22 LAB — LIPID PANEL
Chol/HDL Ratio: 4.8 ratio (ref 0.0–5.0)
Cholesterol, Total: 173 mg/dL (ref 100–199)
HDL: 36 mg/dL — ABNORMAL LOW (ref >39)
LDL Chol Calc (NIH): 112 mg/dL — ABNORMAL HIGH (ref 0–99)
Triglycerides: 139 mg/dL (ref 0–149)
VLDL Cholesterol Cal: 25 mg/dL (ref 5–40)

## 2023-11-22 LAB — T4, FREE: Free T4: 1.31 ng/dL (ref 0.82–1.77)

## 2023-11-30 ENCOUNTER — Ambulatory Visit (INDEPENDENT_AMBULATORY_CARE_PROVIDER_SITE_OTHER): Payer: Medicaid Other | Admitting: "Endocrinology

## 2023-11-30 ENCOUNTER — Encounter: Payer: Self-pay | Admitting: "Endocrinology

## 2023-11-30 VITALS — BP 116/72 | HR 56 | Ht 68.0 in | Wt 157.6 lb

## 2023-11-30 DIAGNOSIS — E230 Hypopituitarism: Secondary | ICD-10-CM

## 2023-11-30 DIAGNOSIS — E039 Hypothyroidism, unspecified: Secondary | ICD-10-CM | POA: Diagnosis not present

## 2023-11-30 DIAGNOSIS — E2740 Unspecified adrenocortical insufficiency: Secondary | ICD-10-CM | POA: Diagnosis not present

## 2023-11-30 DIAGNOSIS — E782 Mixed hyperlipidemia: Secondary | ICD-10-CM | POA: Diagnosis not present

## 2023-11-30 DIAGNOSIS — E291 Testicular hypofunction: Secondary | ICD-10-CM

## 2023-11-30 DIAGNOSIS — F17200 Nicotine dependence, unspecified, uncomplicated: Secondary | ICD-10-CM

## 2023-11-30 MED ORDER — HYDROCORTISONE 10 MG PO TABS: ORAL_TABLET | ORAL | 1 refills | Status: AC

## 2023-11-30 MED ORDER — ATORVASTATIN CALCIUM 20 MG PO TABS: 20.0000 mg | ORAL_TABLET | Freq: Every day | ORAL | 1 refills | Status: DC

## 2023-11-30 MED ORDER — LEVOTHYROXINE SODIUM 100 MCG PO TABS: 100.0000 ug | ORAL_TABLET | Freq: Every day | ORAL | 1 refills | Status: DC
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
## 2023-12-04 ENCOUNTER — Telehealth: Payer: Self-pay

## 2023-12-04 NOTE — Telephone Encounter (Signed)
 Tried to return pt's call, left a message requesting pt return call to the office.

## 2023-12-18 ENCOUNTER — Telehealth: Payer: Self-pay

## 2023-12-18 ENCOUNTER — Other Ambulatory Visit: Payer: Self-pay | Admitting: "Endocrinology

## 2023-12-18 MED ORDER — TESTOSTERONE 1.62 % TD GEL: TRANSDERMAL | 2 refills | Status: DC

## 2023-12-18 NOTE — Telephone Encounter (Signed)
 Received a refill request for pt's Testosterone  gel. Uses [facility] [address] [place]

## 2024-03-12 ENCOUNTER — Other Ambulatory Visit: Payer: Self-pay | Admitting: "Endocrinology

## 2024-03-18 ENCOUNTER — Ambulatory Visit
Admission: EM | Admit: 2024-03-18 | Discharge: 2024-03-18 | Disposition: A | Discharge status: Home / Self Care | Attending: Nurse Practitioner | Admitting: Nurse Practitioner

## 2024-03-18 DIAGNOSIS — R21 Rash and other nonspecific skin eruption: Secondary | ICD-10-CM

## 2024-03-18 MED ORDER — TRIAMCINOLONE ACETONIDE 0.1 % EX CREA: 1.0000 | TOPICAL_CREAM | Freq: Two times a day (BID) | CUTANEOUS | 0 refills | Status: AC

## 2024-03-18 MED ORDER — PREDNISONE 20 MG PO TABS: 40.0000 mg | ORAL_TABLET | Freq: Every day | ORAL | 0 refills | Status: AC

## 2024-03-18 MED ORDER — DEXAMETHASONE SODIUM PHOSPHATE 10 MG/ML IJ SOLN
10.0000 mg | INTRAMUSCULAR | Status: AC
  Administered 2024-03-18: 10 mg via INTRAMUSCULAR

## 2024-03-18 NOTE — Discharge Instructions (Signed)
 You were given an injection of Decadron  10 mg.  Start the prednisone tomorrow. Take medication as prescribed. You may take over-the-counter Zyrtec, Claritin, or Allegra during the daytime and Benadryl  at bedtime to help with itching. Avoid hot baths or showers while symptoms persist.  Recommend taking lukewarm baths. May apply cool cloths to the area to help with itching or discomfort. Avoid scratching, rubbing, or manipulating the areas while symptoms persist. Recommend Aveeno Colloidal Oatmeal Bath to use to help with drying and itching. If symptoms fail to improve with this treatment, you may follow-up in this clinic or with your primary care physician for further evaluation. Follow-up as needed.

## 2024-03-18 NOTE — ED Provider Notes (Signed)
 [facility]    CSN: [id number] Arrival date & time: 03/18/24  1511      History   Chief Complaint No chief complaint on file.   HPI Neil [name] is a 63 y.o. male.   The history is provided by the patient.   Patient presents for complaints of rash to the left shoulder, bilateral groin, and on his bilateral thighs.  Patient states rash has been present for the past several days.  States that the rash is itchy.  He denies exposure to new soaps, medications, lotions, foods, or detergents, but is questioning whether or not this could be caused by his detergent.  Further denies fever, chills, oozing, or drainage from the site.  Per review of chart, patient with history of hidradenitis.  So far, states he has been using over-the-counter creams for itching.  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  Pathology revealed hyperplastic polyps.  Recommended 10-year repeat.   CRANIOTOMY N/A 12/07/2016   Procedure: TRANSPHENOIDAL RESECTION OF TUMOR;  Surgeon: Mavis Purchase, MD;  Location[facility] OR;  Service: Neurosurgery;  Laterality: N/A;  TRANSPHENOIDAL RESECTION OF TUMOR   HEMORROIDECTOMY  2007   TRANSNASAL APPROACH N/A 12/07/2016   Procedure: TRANSNASAL APPROACH;  Surgeon: Ethyl Lonni BRAVO, MD;  Location: [facility] OR;  Service: ENT;  Laterality: N/A;       Home Medications    Prior to Admission medications   Medication Sig Start Date End Date Taking? Authorizing Provider  predniSONE (DELTASONE) 20 MG tablet Take 2 tablets (40 mg total) by mouth daily with breakfast for 5 days. 03/18/24 03/23/24 Yes Leath-Warren, Etta PARAS, NP  triamcinolone cream (KENALOG) 0.1 % Apply 1 Application topically 2 (two) times daily. 03/18/24  Yes Leath-Warren, Etta PARAS, NP  albuterol  (VENTOLIN  HFA) 108 (90 Base) MCG/ACT inhaler Inhale 1-2 puffs into the lungs every 6 (six) hours as needed for wheezing or shortness of breath. 07/26/21   Graham, Laura E, PA-C  atorvastatin  (LIPITOR) 20 MG  tablet TAKE 1 TABLET(20 MG) BY MOUTH AT BEDTIME 03/12/24   Nida, Gebreselassie W, MD  benzonatate  (TESSALON ) 100 MG capsule Take 1 capsule (100 mg total) by mouth 3 (three) times daily as needed for cough. Do not take with alcohol or while operating or driving heavy machinery 09/12/72   Chandra Raisin A, NP  chlorhexidine  (HIBICLENS ) 4 % external liquid Apply topically daily as needed. 10/28/23   Stuart Vernell Norris, PA-C  Cholecalciferol (VITAMIN D3) 125 MCG (5000 UT) CAPS  TAKE 1 CAPSULE BY MOUTH DAILY 05/27/21   Nida, Gebreselassie W, MD  doxycycline  (VIBRAMYCIN ) 100 MG capsule Take 1 capsule (100 mg total) by mouth 2 (two) times daily. 10/28/23   Stuart Vernell Norris, PA-C  hydrocortisone  (ANUSOL -HC) 2.5 % rectal cream Place 1 Application rectally 2 (two) times daily. 03/01/23   Shirlean Therisa ORN, NP  hydrocortisone  (CORTEF ) 10 MG tablet TAKE 1 TABLET BY MOUTH EVERY MORNING AND 1/2 TABLET DAILY AT NOON 11/30/23   Nida, Gebreselassie W, MD  levothyroxine  (SYNTHROID ) 100 MCG tablet Take 1 tablet (100 mcg total) by mouth daily before breakfast. 11/30/23   Nida, Gebreselassie W, MD  Multiple Vitamin (MULTIVITAMIN WITH MINERALS) TABS tablet Take 1 tablet by mouth daily.    [provider]  mupirocin  ointment (BACTROBAN ) 2 % Apply 1 Application topically 2 (two) times daily. 10/28/23   Stuart Vernell Norris, PA-C  Naphazoline HCl (CLEAR EYES OP) Apply 1 drop to eye daily as needed (dry eyes).    [provider]  oxyCODONE -acetaminophen  (PERCOCET) 10-325 MG tablet Take 1 tablet every 8 hours by oral route as needed for 30 days, for chronic pain. 12/26/22   [provider]  sodium chloride  (OCEAN) 0.65 % SOLN nasal spray Place 1 spray into both nostrils as needed for congestion. 01/30/22   Chandra Raisin LABOR, NP  Testosterone  1.62 % GEL APPLY 2 PUMP ACTS ON EACH SHOULDER EVERY MORNING 12/18/23   Lenis Ethelle ORN, MD    Family History Family History  Problem Relation Age of Onset   Hypertension Mother    Stroke Mother    Mental retardation Mother    Heart failure Father    Hypertension Sister    Bipolar disorder Son    Schizophrenia Son    Cancer - Colon Neg Hx        Is pretty sure his dad did NOT have colon CA but not 100% sure   Gastric cancer Neg Hx    Esophageal cancer Neg Hx     Social History Social History   Tobacco Use   Smoking status: Every Day    Current packs/day: 0.50    Average packs/day: 0.5 packs/day for 40.0 years  (20.0 ttl pk-yrs)    Types: Cigarettes   Smokeless tobacco: Never  Vaping Use   Vaping status: Never Used  Substance Use Topics   Alcohol use: No    Alcohol/week: 0.0 standard drinks of alcohol   Drug use: Never    Comment: denies use 01/16/18     Allergies   No known allergies   Review of Systems Review of Systems Per HPI  Physical Exam Triage Vital Signs ED Triage Vitals [03/18/24 1527]  Encounter Vitals Group     BP 127/82     Girls Systolic BP Percentile      Girls Diastolic BP Percentile      Boys Systolic BP Percentile      Boys Diastolic BP Percentile      Pulse Rate 65  Resp 18     Temp 98.2 F (36.8 C)     Temp Source Oral     SpO2 97 %     Weight      Height      Head Circumference      Peak Flow      Pain Score      Pain Loc      Pain Education      Exclude from Growth Chart    No data found.  Updated Vital Signs BP 127/82 (BP Location: Right Arm)   Pulse 65   Temp 98.2 F (36.8 C) (Oral)   Resp 18   SpO2 97%   Visual Acuity Right Eye Distance:   Left Eye Distance:   Bilateral Distance:    Right Eye Near:   Left Eye Near:    Bilateral Near:     Physical Exam Vitals and nursing note reviewed.  Constitutional:      General: He is not in acute distress.    Appearance: Normal appearance.  HENT:     Head: Normocephalic.  Eyes:     Extraocular Movements: Extraocular movements intact.     Pupils: Pupils are equal, round, and reactive to light.  Pulmonary:     Effort: Pulmonary effort is normal.  Musculoskeletal:     Cervical back: Normal range of motion.  Skin:    General: Skin is warm and dry.     Findings: Erythema and rash present. Rash is macular and papular.     Comments: Erythematous macular papular rash noted to the left shoulder, bilateral groin, scrotal sac, and bilateral inner thighs.  There is no oozing, fluctuance, or drainage present. Devere, RN to chaperone.  Neurological:     General: No focal deficit present.      Mental Status: He is alert and oriented to person, place, and time.  Psychiatric:        Mood and Affect: Mood normal.        Behavior: Behavior normal.      UC Treatments / Results  Labs (all labs ordered are listed, but only abnormal results are displayed) Labs Reviewed - No data to display  EKG   Radiology No results found.  Procedures Procedures (including critical care time)  Medications Ordered in UC Medications  dexamethasone  (DECADRON ) injection 10 mg (10 mg Intramuscular Given 03/18/24 1545)    Initial Impression / Assessment and Plan / UC Course  I have reviewed the triage vital signs and the nursing notes.  Pertinent labs & imaging results that were available during my care of the patient were reviewed by me and considered in my medical decision making (see chart for details).  Patient with rash to the left shoulder, bilateral groin, scrotal sac, and bilateral inner thighs.  The rash is erythematous on exam.  The rash is macular papular in presentation, consistent with possible contact dermatitis.  Difficult to determine the cause of the patient's rash, but patient advised to follow-up regarding his suspicion for the detergent he is currently using.  Decadron  10 mg IM administered for inflammation.  Will start patient on prednisone 40 mg for the next 5 days along with triamcinolone cream 0.1% for the patient to apply topically.  Supportive care recommendations were provided and discussed with the patient to include over-the-counter antihistamines, cool compresses to the affected area, and use of Aveeno colloidal oatmeal bath.  Discussed indications with patient regarding follow-up.  Patient was in agreement with this plan of care  and verbalizes understanding.  All questions were answered.  Patient stable for discharge.  Final Clinical Impressions(s) / UC Diagnoses   Final diagnoses:  Rash and nonspecific skin eruption     Discharge Instructions      You were  given an injection of Decadron  10 mg.  Start the prednisone tomorrow. Take medication as prescribed. You may take over-the-counter Zyrtec, Claritin, or Allegra during the daytime and Benadryl  at bedtime to help with itching. Avoid hot baths or showers while symptoms persist.  Recommend taking lukewarm baths. May apply cool cloths to the area to help with itching or discomfort. Avoid scratching, rubbing, or manipulating the areas while symptoms persist. Recommend Aveeno Colloidal Oatmeal Bath to use to help with drying and itching. If symptoms fail to improve with this treatment, you may follow-up in this clinic or with your primary care physician for further evaluation. Follow-up as needed.     ED Prescriptions     Medication Sig Dispense Auth. Provider   predniSONE (DELTASONE) 20 MG tablet Take 2 tablets (40 mg total) by mouth daily with breakfast for 5 days. 10 tablet Leath-Warren, Etta PARAS, NP   triamcinolone cream (KENALOG) 0.1 % Apply 1 Application topically 2 (two) times daily. 453.6 g Leath-Warren, Etta PARAS, NP      PDMP not reviewed this encounter.   Gilmer Etta PARAS, NP 03/18/24 1605

## 2024-03-18 NOTE — ED Triage Notes (Signed)
 Pt reports he has a rash on his left shoulder/chest area x 2 days   Applied anti itch cream

## 2024-04-01 LAB — COMPREHENSIVE METABOLIC PANEL WITH GFR
ALT: 23 IU/L (ref 0–44)
AST: 21 IU/L (ref 0–40)
Albumin: 4.8 g/dL (ref 3.9–4.9)
Alkaline Phosphatase: 54 IU/L (ref 47–123)
BUN/Creatinine Ratio: 10 (ref 10–24)
BUN: 14 mg/dL (ref 8–27)
Bilirubin Total: 0.6 mg/dL (ref 0.0–1.2)
CO2: 24 mmol/L (ref 20–29)
Calcium: 9.9 mg/dL (ref 8.6–10.2)
Chloride: 101 mmol/L (ref 96–106)
Creatinine, Ser: 1.43 mg/dL — ABNORMAL HIGH (ref 0.76–1.27)
Globulin, Total: 2.3 g/dL (ref 1.5–4.5)
Glucose: 95 mg/dL (ref 70–99)
Potassium: 5.1 mmol/L (ref 3.5–5.2)
Sodium: 137 mmol/L (ref 134–144)
Total Protein: 7.1 g/dL (ref 6.0–8.5)
eGFR: 55 mL/min/1.73 — ABNORMAL LOW (ref >59)

## 2024-04-01 LAB — CBC WITH DIFFERENTIAL/PLATELET
Basophils Absolute: 0.1 x10E3/uL (ref 0.0–0.2)
Basos: 1 %
EOS (ABSOLUTE): 0.3 x10E3/uL (ref 0.0–0.4)
Eos: 4 %
Hematocrit: 42.3 % (ref 37.5–51.0)
Hemoglobin: 13.7 g/dL (ref 13.0–17.7)
Immature Grans (Abs): 0.1 x10E3/uL (ref 0.0–0.1)
Immature Granulocytes: 1 %
Lymphocytes Absolute: 4.7 x10E3/uL — ABNORMAL HIGH (ref 0.7–3.1)
Lymphs: 52 %
MCH: 30.6 pg (ref 26.6–33.0)
MCHC: 32.4 g/dL (ref 31.5–35.7)
MCV: 94 fL (ref 79–97)
Monocytes Absolute: 0.5 x10E3/uL (ref 0.1–0.9)
Monocytes: 5 %
Neutrophils Absolute: 3.2 x10E3/uL (ref 1.4–7.0)
Neutrophils: 37 %
Platelets: 334 x10E3/uL (ref 150–450)
RBC: 4.48 x10E6/uL (ref 4.14–5.80)
RDW: 13.5 % (ref 11.6–15.4)
WBC: 8.8 x10E3/uL (ref 3.4–10.8)

## 2024-04-01 LAB — LIPID PANEL
Chol/HDL Ratio: 4.6 ratio (ref 0.0–5.0)
Cholesterol, Total: 199 mg/dL (ref 100–199)
HDL: 43 mg/dL (ref >39)
LDL Chol Calc (NIH): 135 mg/dL — ABNORMAL HIGH (ref 0–99)
Triglycerides: 115 mg/dL (ref 0–149)
VLDL Cholesterol Cal: 21 mg/dL (ref 5–40)

## 2024-04-01 LAB — T4, FREE: Free T4: 1.35 ng/dL (ref 0.82–1.77)

## 2024-04-01 LAB — TESTOSTERONE, FREE, TOTAL, SHBG
Sex Hormone Binding: 30.2 nmol/L (ref 19.3–76.4)
Testosterone, Free: 1.3 pg/mL — ABNORMAL LOW (ref 6.6–18.1)
Testosterone: 188 ng/dL — ABNORMAL LOW (ref 264–916)

## 2024-04-02 ENCOUNTER — Encounter: Payer: Self-pay | Admitting: "Endocrinology

## 2024-04-02 ENCOUNTER — Ambulatory Visit: Admitting: "Endocrinology

## 2024-04-02 VITALS — BP 124/84 | HR 60 | Ht 68.0 in | Wt 160.2 lb

## 2024-04-02 DIAGNOSIS — E039 Hypothyroidism, unspecified: Secondary | ICD-10-CM | POA: Diagnosis not present

## 2024-04-02 DIAGNOSIS — E782 Mixed hyperlipidemia: Secondary | ICD-10-CM

## 2024-04-02 DIAGNOSIS — E230 Hypopituitarism: Secondary | ICD-10-CM | POA: Diagnosis not present

## 2024-04-02 DIAGNOSIS — E2740 Unspecified adrenocortical insufficiency: Secondary | ICD-10-CM

## 2024-04-02 DIAGNOSIS — E291 Testicular hypofunction: Secondary | ICD-10-CM
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
## 2024-06-02 ENCOUNTER — Other Ambulatory Visit: Payer: Self-pay | Admitting: "Endocrinology

## 2024-06-02 DIAGNOSIS — E039 Hypothyroidism, unspecified: Secondary | ICD-10-CM

## 2024-07-14 ENCOUNTER — Other Ambulatory Visit: Payer: Self-pay | Admitting: "Endocrinology

## 2024-08-11 ENCOUNTER — Ambulatory Visit: Admitting: "Endocrinology
